# Patient Record
Sex: Male | Born: 1945 | Race: White | Hispanic: No | Marital: Married | Smoking: Former smoker
Health system: Southern US, Community
[De-identification: ages and names within clinical notes are randomized; demographics above are authoritative.]

## PROBLEM LIST (undated history)

## (undated) DIAGNOSIS — Z955 Presence of coronary angioplasty implant and graft: Secondary | ICD-10-CM

## (undated) DIAGNOSIS — R569 Unspecified convulsions: Secondary | ICD-10-CM

## (undated) HISTORY — PX: CORONARY ANGIOPLASTY WITH STENT PLACEMENT: SHX49

## (undated) HISTORY — DX: Presence of coronary angioplasty implant and graft: Z95.5

## (undated) HISTORY — DX: Unspecified convulsions: R56.9

---

## 1996-11-27 DIAGNOSIS — I219 Acute myocardial infarction, unspecified: Secondary | ICD-10-CM

## 1996-11-27 HISTORY — DX: Acute myocardial infarction, unspecified: I21.9

## 2019-05-15 DIAGNOSIS — E119 Type 2 diabetes mellitus without complications: Secondary | ICD-10-CM | POA: Diagnosis not present

## 2019-05-15 DIAGNOSIS — Z7984 Long term (current) use of oral hypoglycemic drugs: Secondary | ICD-10-CM | POA: Diagnosis not present

## 2019-05-15 DIAGNOSIS — F325 Major depressive disorder, single episode, in full remission: Secondary | ICD-10-CM | POA: Diagnosis not present

## 2019-05-15 DIAGNOSIS — Z7982 Long term (current) use of aspirin: Secondary | ICD-10-CM | POA: Diagnosis not present

## 2019-05-15 DIAGNOSIS — I10 Essential (primary) hypertension: Secondary | ICD-10-CM | POA: Diagnosis not present

## 2019-05-15 DIAGNOSIS — E785 Hyperlipidemia, unspecified: Secondary | ICD-10-CM | POA: Diagnosis not present

## 2019-05-15 DIAGNOSIS — K219 Gastro-esophageal reflux disease without esophagitis: Secondary | ICD-10-CM | POA: Diagnosis not present

## 2019-05-15 DIAGNOSIS — I252 Old myocardial infarction: Secondary | ICD-10-CM | POA: Diagnosis not present

## 2019-05-15 DIAGNOSIS — N400 Enlarged prostate without lower urinary tract symptoms: Secondary | ICD-10-CM | POA: Diagnosis not present

## 2019-07-01 DIAGNOSIS — I10 Essential (primary) hypertension: Secondary | ICD-10-CM | POA: Diagnosis not present

## 2019-07-01 DIAGNOSIS — I2510 Atherosclerotic heart disease of native coronary artery without angina pectoris: Secondary | ICD-10-CM | POA: Diagnosis not present

## 2019-07-01 DIAGNOSIS — E782 Mixed hyperlipidemia: Secondary | ICD-10-CM | POA: Diagnosis not present

## 2019-07-01 DIAGNOSIS — M79604 Pain in right leg: Secondary | ICD-10-CM | POA: Diagnosis not present

## 2019-07-16 DIAGNOSIS — E782 Mixed hyperlipidemia: Secondary | ICD-10-CM | POA: Diagnosis not present

## 2019-07-16 DIAGNOSIS — Z87891 Personal history of nicotine dependence: Secondary | ICD-10-CM | POA: Diagnosis not present

## 2019-07-16 DIAGNOSIS — F339 Major depressive disorder, recurrent, unspecified: Secondary | ICD-10-CM | POA: Diagnosis not present

## 2019-07-16 DIAGNOSIS — K219 Gastro-esophageal reflux disease without esophagitis: Secondary | ICD-10-CM | POA: Diagnosis not present

## 2019-07-16 DIAGNOSIS — I10 Essential (primary) hypertension: Secondary | ICD-10-CM | POA: Diagnosis not present

## 2019-07-16 DIAGNOSIS — R739 Hyperglycemia, unspecified: Secondary | ICD-10-CM | POA: Diagnosis not present

## 2019-07-16 DIAGNOSIS — E1159 Type 2 diabetes mellitus with other circulatory complications: Secondary | ICD-10-CM | POA: Diagnosis not present

## 2019-07-24 DIAGNOSIS — Z951 Presence of aortocoronary bypass graft: Secondary | ICD-10-CM | POA: Diagnosis not present

## 2019-07-24 DIAGNOSIS — I252 Old myocardial infarction: Secondary | ICD-10-CM | POA: Diagnosis not present

## 2019-07-24 DIAGNOSIS — Z0001 Encounter for general adult medical examination with abnormal findings: Secondary | ICD-10-CM | POA: Diagnosis not present

## 2019-07-24 DIAGNOSIS — I10 Essential (primary) hypertension: Secondary | ICD-10-CM | POA: Diagnosis not present

## 2019-07-24 DIAGNOSIS — E1159 Type 2 diabetes mellitus with other circulatory complications: Secondary | ICD-10-CM | POA: Diagnosis not present

## 2019-07-24 DIAGNOSIS — I2510 Atherosclerotic heart disease of native coronary artery without angina pectoris: Secondary | ICD-10-CM | POA: Diagnosis not present

## 2019-07-24 DIAGNOSIS — Z6825 Body mass index (BMI) 25.0-25.9, adult: Secondary | ICD-10-CM | POA: Diagnosis not present

## 2019-07-24 DIAGNOSIS — Z23 Encounter for immunization: Secondary | ICD-10-CM | POA: Diagnosis not present

## 2019-09-01 DIAGNOSIS — Z23 Encounter for immunization: Secondary | ICD-10-CM | POA: Diagnosis not present

## 2019-09-25 DIAGNOSIS — F339 Major depressive disorder, recurrent, unspecified: Secondary | ICD-10-CM | POA: Diagnosis not present

## 2019-09-25 DIAGNOSIS — R413 Other amnesia: Secondary | ICD-10-CM | POA: Diagnosis not present

## 2019-09-25 DIAGNOSIS — E1159 Type 2 diabetes mellitus with other circulatory complications: Secondary | ICD-10-CM | POA: Diagnosis not present

## 2019-09-25 DIAGNOSIS — I10 Essential (primary) hypertension: Secondary | ICD-10-CM | POA: Diagnosis not present

## 2019-09-25 DIAGNOSIS — I209 Angina pectoris, unspecified: Secondary | ICD-10-CM | POA: Diagnosis not present

## 2019-09-25 DIAGNOSIS — Z6826 Body mass index (BMI) 26.0-26.9, adult: Secondary | ICD-10-CM | POA: Diagnosis not present

## 2019-10-22 DIAGNOSIS — N401 Enlarged prostate with lower urinary tract symptoms: Secondary | ICD-10-CM | POA: Diagnosis not present

## 2019-10-22 DIAGNOSIS — F339 Major depressive disorder, recurrent, unspecified: Secondary | ICD-10-CM | POA: Diagnosis not present

## 2019-10-22 DIAGNOSIS — H9190 Unspecified hearing loss, unspecified ear: Secondary | ICD-10-CM | POA: Diagnosis not present

## 2019-10-22 DIAGNOSIS — Z6826 Body mass index (BMI) 26.0-26.9, adult: Secondary | ICD-10-CM | POA: Diagnosis not present

## 2019-10-22 DIAGNOSIS — F0390 Unspecified dementia without behavioral disturbance: Secondary | ICD-10-CM | POA: Diagnosis not present

## 2019-10-22 DIAGNOSIS — R351 Nocturia: Secondary | ICD-10-CM | POA: Diagnosis not present

## 2019-10-27 DIAGNOSIS — Z20828 Contact with and (suspected) exposure to other viral communicable diseases: Secondary | ICD-10-CM | POA: Diagnosis not present

## 2019-10-27 DIAGNOSIS — Z03818 Encounter for observation for suspected exposure to other biological agents ruled out: Secondary | ICD-10-CM | POA: Diagnosis not present

## 2019-10-27 DIAGNOSIS — R519 Headache, unspecified: Secondary | ICD-10-CM | POA: Diagnosis not present

## 2019-11-27 DIAGNOSIS — K219 Gastro-esophageal reflux disease without esophagitis: Secondary | ICD-10-CM | POA: Diagnosis not present

## 2019-11-27 DIAGNOSIS — I10 Essential (primary) hypertension: Secondary | ICD-10-CM | POA: Diagnosis not present

## 2019-12-02 DIAGNOSIS — I10 Essential (primary) hypertension: Secondary | ICD-10-CM | POA: Diagnosis not present

## 2019-12-02 DIAGNOSIS — K219 Gastro-esophageal reflux disease without esophagitis: Secondary | ICD-10-CM | POA: Diagnosis not present

## 2019-12-02 DIAGNOSIS — E1159 Type 2 diabetes mellitus with other circulatory complications: Secondary | ICD-10-CM | POA: Diagnosis not present

## 2019-12-02 DIAGNOSIS — E782 Mixed hyperlipidemia: Secondary | ICD-10-CM | POA: Diagnosis not present

## 2019-12-02 DIAGNOSIS — R739 Hyperglycemia, unspecified: Secondary | ICD-10-CM | POA: Diagnosis not present

## 2019-12-04 DIAGNOSIS — Z87442 Personal history of urinary calculi: Secondary | ICD-10-CM | POA: Diagnosis not present

## 2019-12-04 DIAGNOSIS — N401 Enlarged prostate with lower urinary tract symptoms: Secondary | ICD-10-CM | POA: Diagnosis not present

## 2019-12-04 DIAGNOSIS — R1084 Generalized abdominal pain: Secondary | ICD-10-CM | POA: Diagnosis not present

## 2019-12-05 DIAGNOSIS — Z23 Encounter for immunization: Secondary | ICD-10-CM | POA: Diagnosis not present

## 2019-12-05 DIAGNOSIS — F0390 Unspecified dementia without behavioral disturbance: Secondary | ICD-10-CM | POA: Diagnosis not present

## 2019-12-05 DIAGNOSIS — Z0001 Encounter for general adult medical examination with abnormal findings: Secondary | ICD-10-CM | POA: Diagnosis not present

## 2019-12-05 DIAGNOSIS — I10 Essential (primary) hypertension: Secondary | ICD-10-CM | POA: Diagnosis not present

## 2019-12-05 DIAGNOSIS — E1159 Type 2 diabetes mellitus with other circulatory complications: Secondary | ICD-10-CM | POA: Diagnosis not present

## 2019-12-05 DIAGNOSIS — I739 Peripheral vascular disease, unspecified: Secondary | ICD-10-CM | POA: Diagnosis not present

## 2019-12-05 DIAGNOSIS — I252 Old myocardial infarction: Secondary | ICD-10-CM | POA: Diagnosis not present

## 2019-12-05 DIAGNOSIS — K5730 Diverticulosis of large intestine without perforation or abscess without bleeding: Secondary | ICD-10-CM | POA: Diagnosis not present

## 2019-12-09 DIAGNOSIS — J069 Acute upper respiratory infection, unspecified: Secondary | ICD-10-CM | POA: Diagnosis not present

## 2019-12-09 DIAGNOSIS — Z20822 Contact with and (suspected) exposure to covid-19: Secondary | ICD-10-CM | POA: Diagnosis not present

## 2019-12-26 DIAGNOSIS — E1165 Type 2 diabetes mellitus with hyperglycemia: Secondary | ICD-10-CM | POA: Diagnosis not present

## 2019-12-26 DIAGNOSIS — I10 Essential (primary) hypertension: Secondary | ICD-10-CM | POA: Diagnosis not present

## 2019-12-26 DIAGNOSIS — E7849 Other hyperlipidemia: Secondary | ICD-10-CM | POA: Diagnosis not present

## 2020-01-23 DIAGNOSIS — E7849 Other hyperlipidemia: Secondary | ICD-10-CM | POA: Diagnosis not present

## 2020-01-23 DIAGNOSIS — I10 Essential (primary) hypertension: Secondary | ICD-10-CM | POA: Diagnosis not present

## 2020-02-15 DIAGNOSIS — R9082 White matter disease, unspecified: Secondary | ICD-10-CM | POA: Diagnosis not present

## 2020-02-15 DIAGNOSIS — R29700 NIHSS score 0: Secondary | ICD-10-CM | POA: Diagnosis not present

## 2020-02-15 DIAGNOSIS — R42 Dizziness and giddiness: Secondary | ICD-10-CM | POA: Diagnosis not present

## 2020-02-15 DIAGNOSIS — I252 Old myocardial infarction: Secondary | ICD-10-CM | POA: Diagnosis not present

## 2020-02-15 DIAGNOSIS — E785 Hyperlipidemia, unspecified: Secondary | ICD-10-CM | POA: Diagnosis not present

## 2020-02-15 DIAGNOSIS — I10 Essential (primary) hypertension: Secondary | ICD-10-CM | POA: Diagnosis not present

## 2020-02-25 DIAGNOSIS — E7849 Other hyperlipidemia: Secondary | ICD-10-CM | POA: Diagnosis not present

## 2020-02-25 DIAGNOSIS — I10 Essential (primary) hypertension: Secondary | ICD-10-CM | POA: Diagnosis not present

## 2020-03-10 DIAGNOSIS — I10 Essential (primary) hypertension: Secondary | ICD-10-CM | POA: Diagnosis not present

## 2020-03-10 DIAGNOSIS — E782 Mixed hyperlipidemia: Secondary | ICD-10-CM | POA: Diagnosis not present

## 2020-03-10 DIAGNOSIS — K219 Gastro-esophageal reflux disease without esophagitis: Secondary | ICD-10-CM | POA: Diagnosis not present

## 2020-03-10 DIAGNOSIS — E1159 Type 2 diabetes mellitus with other circulatory complications: Secondary | ICD-10-CM | POA: Diagnosis not present

## 2020-03-10 DIAGNOSIS — R739 Hyperglycemia, unspecified: Secondary | ICD-10-CM | POA: Diagnosis not present

## 2020-03-17 DIAGNOSIS — F0390 Unspecified dementia without behavioral disturbance: Secondary | ICD-10-CM | POA: Diagnosis not present

## 2020-03-17 DIAGNOSIS — I10 Essential (primary) hypertension: Secondary | ICD-10-CM | POA: Diagnosis not present

## 2020-03-17 DIAGNOSIS — Z951 Presence of aortocoronary bypass graft: Secondary | ICD-10-CM | POA: Diagnosis not present

## 2020-03-17 DIAGNOSIS — E782 Mixed hyperlipidemia: Secondary | ICD-10-CM | POA: Diagnosis not present

## 2020-03-17 DIAGNOSIS — E1159 Type 2 diabetes mellitus with other circulatory complications: Secondary | ICD-10-CM | POA: Diagnosis not present

## 2020-03-17 DIAGNOSIS — I252 Old myocardial infarction: Secondary | ICD-10-CM | POA: Diagnosis not present

## 2020-03-17 DIAGNOSIS — F339 Major depressive disorder, recurrent, unspecified: Secondary | ICD-10-CM | POA: Diagnosis not present

## 2020-03-17 DIAGNOSIS — Z6826 Body mass index (BMI) 26.0-26.9, adult: Secondary | ICD-10-CM | POA: Diagnosis not present

## 2020-03-26 DIAGNOSIS — I10 Essential (primary) hypertension: Secondary | ICD-10-CM | POA: Diagnosis not present

## 2020-03-26 DIAGNOSIS — E7849 Other hyperlipidemia: Secondary | ICD-10-CM | POA: Diagnosis not present

## 2020-04-14 DIAGNOSIS — I2510 Atherosclerotic heart disease of native coronary artery without angina pectoris: Secondary | ICD-10-CM | POA: Diagnosis not present

## 2020-04-14 DIAGNOSIS — I10 Essential (primary) hypertension: Secondary | ICD-10-CM | POA: Diagnosis not present

## 2020-04-14 DIAGNOSIS — R0789 Other chest pain: Secondary | ICD-10-CM | POA: Diagnosis not present

## 2020-04-14 DIAGNOSIS — R079 Chest pain, unspecified: Secondary | ICD-10-CM | POA: Diagnosis not present

## 2020-04-15 DIAGNOSIS — I10 Essential (primary) hypertension: Secondary | ICD-10-CM | POA: Diagnosis not present

## 2020-04-15 DIAGNOSIS — R5383 Other fatigue: Secondary | ICD-10-CM | POA: Diagnosis not present

## 2020-04-16 DIAGNOSIS — M79604 Pain in right leg: Secondary | ICD-10-CM | POA: Diagnosis not present

## 2020-04-16 DIAGNOSIS — E119 Type 2 diabetes mellitus without complications: Secondary | ICD-10-CM | POA: Diagnosis not present

## 2020-04-16 DIAGNOSIS — E782 Mixed hyperlipidemia: Secondary | ICD-10-CM | POA: Diagnosis not present

## 2020-04-16 DIAGNOSIS — I25119 Atherosclerotic heart disease of native coronary artery with unspecified angina pectoris: Secondary | ICD-10-CM | POA: Diagnosis not present

## 2020-04-16 DIAGNOSIS — R079 Chest pain, unspecified: Secondary | ICD-10-CM | POA: Diagnosis not present

## 2020-04-16 DIAGNOSIS — I209 Angina pectoris, unspecified: Secondary | ICD-10-CM | POA: Diagnosis not present

## 2020-04-16 DIAGNOSIS — R931 Abnormal findings on diagnostic imaging of heart and coronary circulation: Secondary | ICD-10-CM | POA: Diagnosis not present

## 2020-04-16 DIAGNOSIS — F329 Major depressive disorder, single episode, unspecified: Secondary | ICD-10-CM | POA: Diagnosis not present

## 2020-04-16 DIAGNOSIS — I25110 Atherosclerotic heart disease of native coronary artery with unstable angina pectoris: Secondary | ICD-10-CM | POA: Diagnosis not present

## 2020-04-16 DIAGNOSIS — I10 Essential (primary) hypertension: Secondary | ICD-10-CM | POA: Diagnosis not present

## 2020-04-16 DIAGNOSIS — M79605 Pain in left leg: Secondary | ICD-10-CM | POA: Diagnosis not present

## 2020-04-16 DIAGNOSIS — I999 Unspecified disorder of circulatory system: Secondary | ICD-10-CM | POA: Diagnosis not present

## 2020-04-16 DIAGNOSIS — Z8679 Personal history of other diseases of the circulatory system: Secondary | ICD-10-CM | POA: Diagnosis not present

## 2020-04-16 DIAGNOSIS — I2510 Atherosclerotic heart disease of native coronary artery without angina pectoris: Secondary | ICD-10-CM | POA: Diagnosis not present

## 2020-04-16 DIAGNOSIS — I2582 Chronic total occlusion of coronary artery: Secondary | ICD-10-CM | POA: Diagnosis not present

## 2020-04-16 DIAGNOSIS — K219 Gastro-esophageal reflux disease without esophagitis: Secondary | ICD-10-CM | POA: Diagnosis not present

## 2020-04-17 DIAGNOSIS — I25110 Atherosclerotic heart disease of native coronary artery with unstable angina pectoris: Secondary | ICD-10-CM | POA: Diagnosis not present

## 2020-04-17 DIAGNOSIS — Z951 Presence of aortocoronary bypass graft: Secondary | ICD-10-CM | POA: Diagnosis not present

## 2020-04-17 DIAGNOSIS — Z9889 Other specified postprocedural states: Secondary | ICD-10-CM | POA: Diagnosis not present

## 2020-04-17 DIAGNOSIS — R9439 Abnormal result of other cardiovascular function study: Secondary | ICD-10-CM | POA: Diagnosis not present

## 2020-04-17 DIAGNOSIS — E119 Type 2 diabetes mellitus without complications: Secondary | ICD-10-CM | POA: Diagnosis not present

## 2020-04-17 DIAGNOSIS — I200 Unstable angina: Secondary | ICD-10-CM | POA: Diagnosis not present

## 2020-04-17 DIAGNOSIS — Z87891 Personal history of nicotine dependence: Secondary | ICD-10-CM | POA: Diagnosis not present

## 2020-04-17 DIAGNOSIS — I10 Essential (primary) hypertension: Secondary | ICD-10-CM | POA: Diagnosis not present

## 2020-04-17 DIAGNOSIS — I110 Hypertensive heart disease with heart failure: Secondary | ICD-10-CM | POA: Diagnosis not present

## 2020-04-17 DIAGNOSIS — Z79899 Other long term (current) drug therapy: Secondary | ICD-10-CM | POA: Diagnosis not present

## 2020-04-17 DIAGNOSIS — K219 Gastro-esophageal reflux disease without esophagitis: Secondary | ICD-10-CM | POA: Diagnosis not present

## 2020-04-17 DIAGNOSIS — F329 Major depressive disorder, single episode, unspecified: Secondary | ICD-10-CM | POA: Diagnosis not present

## 2020-04-17 DIAGNOSIS — Z7982 Long term (current) use of aspirin: Secondary | ICD-10-CM | POA: Diagnosis not present

## 2020-04-17 DIAGNOSIS — I25118 Atherosclerotic heart disease of native coronary artery with other forms of angina pectoris: Secondary | ICD-10-CM | POA: Diagnosis not present

## 2020-04-21 DIAGNOSIS — I2510 Atherosclerotic heart disease of native coronary artery without angina pectoris: Secondary | ICD-10-CM | POA: Diagnosis not present

## 2020-04-21 DIAGNOSIS — M79604 Pain in right leg: Secondary | ICD-10-CM | POA: Diagnosis not present

## 2020-04-21 DIAGNOSIS — I10 Essential (primary) hypertension: Secondary | ICD-10-CM | POA: Diagnosis not present

## 2020-04-21 DIAGNOSIS — E782 Mixed hyperlipidemia: Secondary | ICD-10-CM | POA: Diagnosis not present

## 2020-04-26 DIAGNOSIS — E1159 Type 2 diabetes mellitus with other circulatory complications: Secondary | ICD-10-CM | POA: Diagnosis not present

## 2020-04-26 DIAGNOSIS — I10 Essential (primary) hypertension: Secondary | ICD-10-CM | POA: Diagnosis not present

## 2020-04-26 DIAGNOSIS — K219 Gastro-esophageal reflux disease without esophagitis: Secondary | ICD-10-CM | POA: Diagnosis not present

## 2020-04-26 DIAGNOSIS — E7849 Other hyperlipidemia: Secondary | ICD-10-CM | POA: Diagnosis not present

## 2020-06-09 DIAGNOSIS — Z87891 Personal history of nicotine dependence: Secondary | ICD-10-CM | POA: Diagnosis not present

## 2020-06-09 DIAGNOSIS — I10 Essential (primary) hypertension: Secondary | ICD-10-CM | POA: Diagnosis not present

## 2020-06-09 DIAGNOSIS — K219 Gastro-esophageal reflux disease without esophagitis: Secondary | ICD-10-CM | POA: Diagnosis not present

## 2020-06-09 DIAGNOSIS — R739 Hyperglycemia, unspecified: Secondary | ICD-10-CM | POA: Diagnosis not present

## 2020-06-09 DIAGNOSIS — E782 Mixed hyperlipidemia: Secondary | ICD-10-CM | POA: Diagnosis not present

## 2020-06-09 DIAGNOSIS — E1159 Type 2 diabetes mellitus with other circulatory complications: Secondary | ICD-10-CM | POA: Diagnosis not present

## 2020-06-14 DIAGNOSIS — Z951 Presence of aortocoronary bypass graft: Secondary | ICD-10-CM | POA: Diagnosis not present

## 2020-06-14 DIAGNOSIS — Z8042 Family history of malignant neoplasm of prostate: Secondary | ICD-10-CM | POA: Diagnosis not present

## 2020-06-14 DIAGNOSIS — I252 Old myocardial infarction: Secondary | ICD-10-CM | POA: Diagnosis not present

## 2020-06-14 DIAGNOSIS — I10 Essential (primary) hypertension: Secondary | ICD-10-CM | POA: Diagnosis not present

## 2020-06-14 DIAGNOSIS — Z955 Presence of coronary angioplasty implant and graft: Secondary | ICD-10-CM | POA: Diagnosis not present

## 2020-06-14 DIAGNOSIS — I2510 Atherosclerotic heart disease of native coronary artery without angina pectoris: Secondary | ICD-10-CM | POA: Diagnosis not present

## 2020-06-14 DIAGNOSIS — E1159 Type 2 diabetes mellitus with other circulatory complications: Secondary | ICD-10-CM | POA: Diagnosis not present

## 2020-06-14 DIAGNOSIS — Z6826 Body mass index (BMI) 26.0-26.9, adult: Secondary | ICD-10-CM | POA: Diagnosis not present

## 2020-06-15 DIAGNOSIS — H524 Presbyopia: Secondary | ICD-10-CM | POA: Diagnosis not present

## 2020-06-15 DIAGNOSIS — H25811 Combined forms of age-related cataract, right eye: Secondary | ICD-10-CM | POA: Diagnosis not present

## 2020-06-15 DIAGNOSIS — H2512 Age-related nuclear cataract, left eye: Secondary | ICD-10-CM | POA: Diagnosis not present

## 2020-06-25 DIAGNOSIS — E1159 Type 2 diabetes mellitus with other circulatory complications: Secondary | ICD-10-CM | POA: Diagnosis not present

## 2020-06-25 DIAGNOSIS — K219 Gastro-esophageal reflux disease without esophagitis: Secondary | ICD-10-CM | POA: Diagnosis not present

## 2020-06-25 DIAGNOSIS — I10 Essential (primary) hypertension: Secondary | ICD-10-CM | POA: Diagnosis not present

## 2020-06-25 DIAGNOSIS — E7849 Other hyperlipidemia: Secondary | ICD-10-CM | POA: Diagnosis not present

## 2020-07-13 DIAGNOSIS — R55 Syncope and collapse: Secondary | ICD-10-CM | POA: Diagnosis not present

## 2020-07-13 DIAGNOSIS — S0003XA Contusion of scalp, initial encounter: Secondary | ICD-10-CM | POA: Diagnosis not present

## 2020-07-13 DIAGNOSIS — E119 Type 2 diabetes mellitus without complications: Secondary | ICD-10-CM | POA: Diagnosis not present

## 2020-07-13 DIAGNOSIS — Z7982 Long term (current) use of aspirin: Secondary | ICD-10-CM | POA: Diagnosis not present

## 2020-07-13 DIAGNOSIS — R410 Disorientation, unspecified: Secondary | ICD-10-CM | POA: Diagnosis not present

## 2020-07-13 DIAGNOSIS — N200 Calculus of kidney: Secondary | ICD-10-CM | POA: Diagnosis not present

## 2020-07-13 DIAGNOSIS — Z043 Encounter for examination and observation following other accident: Secondary | ICD-10-CM | POA: Diagnosis not present

## 2020-07-13 DIAGNOSIS — S066X0A Traumatic subarachnoid hemorrhage without loss of consciousness, initial encounter: Secondary | ICD-10-CM | POA: Diagnosis not present

## 2020-07-13 DIAGNOSIS — W1830XA Fall on same level, unspecified, initial encounter: Secondary | ICD-10-CM | POA: Diagnosis not present

## 2020-07-13 DIAGNOSIS — S069X9A Unspecified intracranial injury with loss of consciousness of unspecified duration, initial encounter: Secondary | ICD-10-CM | POA: Diagnosis not present

## 2020-07-13 DIAGNOSIS — E785 Hyperlipidemia, unspecified: Secondary | ICD-10-CM | POA: Diagnosis not present

## 2020-07-13 DIAGNOSIS — Z7902 Long term (current) use of antithrombotics/antiplatelets: Secondary | ICD-10-CM | POA: Diagnosis not present

## 2020-07-13 DIAGNOSIS — Y92007 Garden or yard of unspecified non-institutional (private) residence as the place of occurrence of the external cause: Secondary | ICD-10-CM | POA: Diagnosis not present

## 2020-07-13 DIAGNOSIS — F0390 Unspecified dementia without behavioral disturbance: Secondary | ICD-10-CM | POA: Diagnosis not present

## 2020-07-13 DIAGNOSIS — I951 Orthostatic hypotension: Secondary | ICD-10-CM | POA: Diagnosis not present

## 2020-07-13 DIAGNOSIS — R079 Chest pain, unspecified: Secondary | ICD-10-CM | POA: Diagnosis not present

## 2020-07-13 DIAGNOSIS — S065X0A Traumatic subdural hemorrhage without loss of consciousness, initial encounter: Secondary | ICD-10-CM | POA: Diagnosis not present

## 2020-07-13 DIAGNOSIS — W1789XA Other fall from one level to another, initial encounter: Secondary | ICD-10-CM | POA: Diagnosis not present

## 2020-07-13 DIAGNOSIS — M5136 Other intervertebral disc degeneration, lumbar region: Secondary | ICD-10-CM | POA: Diagnosis not present

## 2020-07-13 DIAGNOSIS — R110 Nausea: Secondary | ICD-10-CM | POA: Diagnosis not present

## 2020-07-13 DIAGNOSIS — I629 Nontraumatic intracranial hemorrhage, unspecified: Secondary | ICD-10-CM | POA: Diagnosis not present

## 2020-07-13 DIAGNOSIS — I252 Old myocardial infarction: Secondary | ICD-10-CM | POA: Diagnosis not present

## 2020-07-13 DIAGNOSIS — R4182 Altered mental status, unspecified: Secondary | ICD-10-CM | POA: Diagnosis not present

## 2020-07-13 DIAGNOSIS — Z951 Presence of aortocoronary bypass graft: Secondary | ICD-10-CM | POA: Diagnosis not present

## 2020-07-13 DIAGNOSIS — Z881 Allergy status to other antibiotic agents status: Secondary | ICD-10-CM | POA: Diagnosis not present

## 2020-07-13 DIAGNOSIS — I2510 Atherosclerotic heart disease of native coronary artery without angina pectoris: Secondary | ICD-10-CM | POA: Diagnosis not present

## 2020-07-13 DIAGNOSIS — S06330A Contusion and laceration of cerebrum, unspecified, without loss of consciousness, initial encounter: Secondary | ICD-10-CM | POA: Diagnosis not present

## 2020-07-13 DIAGNOSIS — Z955 Presence of coronary angioplasty implant and graft: Secondary | ICD-10-CM | POA: Diagnosis not present

## 2020-07-13 DIAGNOSIS — W19XXXA Unspecified fall, initial encounter: Secondary | ICD-10-CM | POA: Diagnosis not present

## 2020-07-13 DIAGNOSIS — S06369A Traumatic hemorrhage of cerebrum, unspecified, with loss of consciousness of unspecified duration, initial encounter: Secondary | ICD-10-CM | POA: Diagnosis not present

## 2020-07-13 DIAGNOSIS — S066X9A Traumatic subarachnoid hemorrhage with loss of consciousness of unspecified duration, initial encounter: Secondary | ICD-10-CM | POA: Diagnosis not present

## 2020-07-13 DIAGNOSIS — S06360A Traumatic hemorrhage of cerebrum, unspecified, without loss of consciousness, initial encounter: Secondary | ICD-10-CM | POA: Diagnosis not present

## 2020-07-13 DIAGNOSIS — K219 Gastro-esophageal reflux disease without esophagitis: Secondary | ICD-10-CM | POA: Diagnosis not present

## 2020-07-13 DIAGNOSIS — S06300A Unspecified focal traumatic brain injury without loss of consciousness, initial encounter: Secondary | ICD-10-CM | POA: Diagnosis not present

## 2020-07-13 DIAGNOSIS — I10 Essential (primary) hypertension: Secondary | ICD-10-CM | POA: Diagnosis not present

## 2020-07-14 DIAGNOSIS — K219 Gastro-esophageal reflux disease without esophagitis: Secondary | ICD-10-CM | POA: Diagnosis not present

## 2020-07-14 DIAGNOSIS — I951 Orthostatic hypotension: Secondary | ICD-10-CM | POA: Diagnosis not present

## 2020-07-14 DIAGNOSIS — R55 Syncope and collapse: Secondary | ICD-10-CM | POA: Diagnosis not present

## 2020-07-14 DIAGNOSIS — R9431 Abnormal electrocardiogram [ECG] [EKG]: Secondary | ICD-10-CM | POA: Diagnosis not present

## 2020-07-14 DIAGNOSIS — I2510 Atherosclerotic heart disease of native coronary artery without angina pectoris: Secondary | ICD-10-CM | POA: Diagnosis not present

## 2020-07-14 DIAGNOSIS — S06360A Traumatic hemorrhage of cerebrum, unspecified, without loss of consciousness, initial encounter: Secondary | ICD-10-CM | POA: Diagnosis not present

## 2020-07-14 DIAGNOSIS — Z951 Presence of aortocoronary bypass graft: Secondary | ICD-10-CM | POA: Diagnosis not present

## 2020-07-14 DIAGNOSIS — E119 Type 2 diabetes mellitus without complications: Secondary | ICD-10-CM | POA: Diagnosis not present

## 2020-07-14 DIAGNOSIS — E785 Hyperlipidemia, unspecified: Secondary | ICD-10-CM | POA: Diagnosis not present

## 2020-07-14 DIAGNOSIS — I10 Essential (primary) hypertension: Secondary | ICD-10-CM | POA: Diagnosis not present

## 2020-07-14 DIAGNOSIS — S066X0A Traumatic subarachnoid hemorrhage without loss of consciousness, initial encounter: Secondary | ICD-10-CM | POA: Diagnosis not present

## 2020-07-14 DIAGNOSIS — S06330A Contusion and laceration of cerebrum, unspecified, without loss of consciousness, initial encounter: Secondary | ICD-10-CM | POA: Diagnosis not present

## 2020-07-14 DIAGNOSIS — R110 Nausea: Secondary | ICD-10-CM | POA: Diagnosis not present

## 2020-07-14 DIAGNOSIS — I609 Nontraumatic subarachnoid hemorrhage, unspecified: Secondary | ICD-10-CM | POA: Diagnosis not present

## 2020-07-15 DIAGNOSIS — R55 Syncope and collapse: Secondary | ICD-10-CM | POA: Diagnosis not present

## 2020-07-15 DIAGNOSIS — R9431 Abnormal electrocardiogram [ECG] [EKG]: Secondary | ICD-10-CM | POA: Diagnosis not present

## 2020-07-18 ENCOUNTER — Other Ambulatory Visit: Payer: Self-pay

## 2020-07-18 ENCOUNTER — Emergency Department (HOSPITAL_COMMUNITY): Payer: Medicare PPO

## 2020-07-18 ENCOUNTER — Emergency Department (HOSPITAL_COMMUNITY)
Admission: EM | Admit: 2020-07-18 | Discharge: 2020-07-18 | Disposition: A | Discharge status: Home / Self Care | Payer: Medicare PPO | Attending: Emergency Medicine | Admitting: Emergency Medicine

## 2020-07-18 DIAGNOSIS — R569 Unspecified convulsions: Secondary | ICD-10-CM | POA: Insufficient documentation

## 2020-07-18 DIAGNOSIS — S066X0A Traumatic subarachnoid hemorrhage without loss of consciousness, initial encounter: Secondary | ICD-10-CM | POA: Insufficient documentation

## 2020-07-18 DIAGNOSIS — R519 Headache, unspecified: Secondary | ICD-10-CM | POA: Diagnosis not present

## 2020-07-18 DIAGNOSIS — G40909 Epilepsy, unspecified, not intractable, without status epilepticus: Secondary | ICD-10-CM | POA: Diagnosis not present

## 2020-07-18 DIAGNOSIS — Z79899 Other long term (current) drug therapy: Secondary | ICD-10-CM | POA: Diagnosis not present

## 2020-07-18 DIAGNOSIS — R531 Weakness: Secondary | ICD-10-CM | POA: Diagnosis not present

## 2020-07-18 DIAGNOSIS — X58XXXA Exposure to other specified factors, initial encounter: Secondary | ICD-10-CM | POA: Diagnosis not present

## 2020-07-18 LAB — COMPREHENSIVE METABOLIC PANEL
ALT: 23 U/L (ref 0–44)
AST: 22 U/L (ref 15–41)
Albumin: 3.9 g/dL (ref 3.5–5.0)
Alkaline Phosphatase: 96 U/L (ref 38–126)
Anion gap: 8 (ref 5–15)
BUN: 10 mg/dL (ref 8–23)
CO2: 31 mmol/L (ref 22–32)
Calcium: 9.7 mg/dL (ref 8.9–10.3)
Chloride: 100 mmol/L (ref 98–111)
Creatinine, Ser: 0.85 mg/dL (ref 0.61–1.24)
GFR calc Af Amer: >60 mL/min (ref >60)
GFR calc non Af Amer: >60 mL/min (ref >60)
Glucose, Bld: 96 mg/dL (ref 70–99)
Potassium: 4.1 mmol/L (ref 3.5–5.1)
Sodium: 139 mmol/L (ref 135–145)
Total Bilirubin: 0.6 mg/dL (ref 0.3–1.2)
Total Protein: 6.8 g/dL (ref 6.5–8.1)

## 2020-07-18 LAB — PROTIME-INR
INR: 1 (ref 0.8–1.2)
Prothrombin Time: 12.3 seconds (ref 11.4–15.2)

## 2020-07-18 LAB — CBC
HCT: 46.4 % (ref 39.0–52.0)
Hemoglobin: 15.2 g/dL (ref 13.0–17.0)
MCH: 28.9 pg (ref 26.0–34.0)
MCHC: 32.8 g/dL (ref 30.0–36.0)
MCV: 88.2 fL (ref 80.0–100.0)
Platelets: 171 10*3/uL (ref 150–400)
RBC: 5.26 MIL/uL (ref 4.22–5.81)
RDW: 12.9 % (ref 11.5–15.5)
WBC: 9.6 10*3/uL (ref 4.0–10.5)
nRBC: 0 % (ref 0.0–0.2)

## 2020-07-18 LAB — DIFFERENTIAL
Abs Immature Granulocytes: 0.06 10*3/uL (ref 0.00–0.07)
Basophils Absolute: 0.1 10*3/uL (ref 0.0–0.1)
Basophils Relative: 1 %
Eosinophils Absolute: 0.3 10*3/uL (ref 0.0–0.5)
Eosinophils Relative: 3 %
Immature Granulocytes: 1 %
Lymphocytes Relative: 17 %
Lymphs Abs: 1.7 10*3/uL (ref 0.7–4.0)
Monocytes Absolute: 1 10*3/uL (ref 0.1–1.0)
Monocytes Relative: 10 %
Neutro Abs: 6.6 10*3/uL (ref 1.7–7.7)
Neutrophils Relative %: 68 %

## 2020-07-18 LAB — APTT: aPTT: 33 seconds (ref 24–36)

## 2020-07-18 MED ORDER — LEVETIRACETAM 500 MG PO TABS
500.0000 mg | ORAL_TABLET | Freq: Once | ORAL | Status: AC
  Administered 2020-07-18: 500 mg via ORAL
  Filled 2020-07-18: qty 1

## 2020-07-18 MED ORDER — LEVETIRACETAM 500 MG PO TABS: 500.0000 mg | ORAL_TABLET | Freq: Two times a day (BID) | ORAL | 0 refills | Status: DC

## 2020-07-18 MED ORDER — LEVETIRACETAM 500 MG PO TABS
1000.0000 mg | ORAL_TABLET | Freq: Once | ORAL | Status: AC
  Administered 2020-07-18: 1000 mg via ORAL
  Filled 2020-07-18: qty 2

## 2020-07-18 MED ORDER — SODIUM CHLORIDE 0.9% FLUSH: 3.0000 mL | Freq: Once | INTRAVENOUS | Status: DC

## 2020-07-18 NOTE — ED Provider Notes (Signed)
[facility] EMERGENCY DEPARTMENT Provider Note   CSN: [id number] Arrival date & time: 07/18/20  1417     History Chief Complaint  Patient presents with  . Weakness    George Riley is a 74 y.o. male.   Seizures Seizure activity on arrival: no   Seizure type:  Partial simple and myoclonic Preceding symptoms: headache and numbness   Initial focality:  Right-sided Episode characteristics: abnormal movements, focal shaking and partial responsiveness   Return to baseline: yes   Severity:  Moderate Timing:  Once Number of seizures this episode:  3 Progression:  Unchanged Context comment:  Recent head injury with SAH Recent head injury:  No recent head injuries PTA treatment:  None History of seizures: no        No past medical history on file.  There are no problems to display for this patient.    The histories are not reviewed yet. Please review them in the "History" navigator section and refresh this SmartLink.     No family history on file.  Social History   Tobacco Use  . Smoking status: Not on file  Substance Use Topics  . Alcohol use: Not on file  . Drug use: Not on file    Home Medications Prior to Admission medications   Medication Sig Start Date End Date Taking? Authorizing Provider  levETIRAcetam (KEPPRA) 500 MG tablet Take 1 tablet (500 mg total) by mouth 2 (two) times daily. 07/18/20 08/17/20  Sabino Donovan, MD    Allergies    Patient has no allergy information on record.  Review of Systems   Review of Systems  Constitutional: Negative for chills and fever.  HENT: Negative for congestion and rhinorrhea.   Respiratory: Negative for cough and shortness of breath.   Cardiovascular: Negative for chest pain and palpitations.  Gastrointestinal: Negative for diarrhea, nausea and vomiting.  Genitourinary: Negative for difficulty urinating and dysuria.  Musculoskeletal: Negative for arthralgias and back pain.  Skin: Negative for  color change and rash.  Neurological: Positive for seizures and headaches. Negative for light-headedness.    Physical Exam Updated Vital Signs BP 135/80 (BP Location: Right Arm)   Pulse 60   Temp 99 F (37.2 C) (Oral)   Resp 18   Ht 5\' 8"  (1.727 m)   Wt 77.1 kg   SpO2 98%   BMI 25.85 kg/m   Physical Exam Vitals and nursing note reviewed.  Constitutional:      General: He is not in acute distress.    Appearance: Normal appearance.  HENT:     Head: Normocephalic and atraumatic.     Nose: No rhinorrhea.  Eyes:     General:        Right eye: No discharge.        Left eye: No discharge.     Conjunctiva/sclera: Conjunctivae normal.     Pupils: Pupils are equal, round, and reactive to light.  Cardiovascular:     Rate and Rhythm: Normal rate and regular rhythm.  Pulmonary:     Effort: Pulmonary effort is normal.     Breath sounds: No stridor.  Abdominal:     General: Abdomen is flat. There is no distension.     Palpations: Abdomen is soft.  Musculoskeletal:        General: No deformity or signs of injury.  Skin:    General: Skin is warm and dry.  Neurological:     General: No focal deficit present.  Mental Status: He is alert. Mental status is at baseline.     Motor: No weakness.     Comments: 5 out of 5 motor strength in all extremities, sensation intact throughout, no dysmetria, no dysdiadochokinesia, no ataxia with ambulation, cranial nerves II through XII intact, alert and oriented to person place and time   Psychiatric:        Mood and Affect: Mood normal.        Behavior: Behavior normal.        Thought Content: Thought content normal.     ED Results / Procedures / Treatments   Labs (all labs ordered are listed, but only abnormal results are displayed) Labs Reviewed  PROTIME-INR  APTT  CBC  DIFFERENTIAL  COMPREHENSIVE METABOLIC PANEL    EKG EKG Interpretation  Date/Time:  Sunday July 18 2020 14:56:42 EDT Ventricular Rate:  60 PR  Interval:  146 QRS Duration: 88 QT Interval:  418 QTC Calculation: 418 R Axis:   81 Text Interpretation: Normal sinus rhythm Nonspecific T wave abnormality Abnormal ECG Confirmed by Cherlynn Perches ([id number]) on 07/18/2020 5:26:14 PM   Radiology CT HEAD WO CONTRAST  Addendum Date: 07/18/2020   ADDENDUM REPORT: 07/18/2020 22:16 ADDENDUM: A comparison study dated 07/13/2020 at 4:52 p.m. has been provided. The distribution and volume of subarachnoid hemorrhage along the left frontal convexity is not appreciably changed since the previous examination. There are no new areas of hemorrhage. The left parietal scalp hematoma seen on prior study has resolved in the interim. Electronically Signed   By: Sharlet Salina M.D.   On: 07/18/2020 22:16   Result Date: 07/18/2020 CLINICAL DATA:  Syncope yesterday, hit back of head, amnesia EXAM: CT HEAD WITHOUT CONTRAST TECHNIQUE: Contiguous axial images were obtained from the base of the skull through the vertex without intravenous contrast. COMPARISON:  None. FINDINGS: Brain: No acute infarct. Subarachnoid hemorrhage is seen within the sulci along the left frontal convexity. Lateral ventricles and midline structures are unremarkable. No mass effect. Vascular: No hyperdense vessel or unexpected calcification. Skull: Normal. Negative for fracture or focal lesion. Sinuses/Orbits: No acute finding. Other: None. IMPRESSION: 1. Acute subarachnoid hemorrhage along the sulci at the left frontal convexity. No mass effect. 2. No acute infarct. Critical Value/emergent results were called by telephone at the time of interpretation on 07/18/2020 at 4:39 pm to provider MELANIE BELFI , who verbally acknowledged these results. Electronically Signed: By: Sharlet Salina M.D. On: 07/18/2020 16:38    Procedures Procedures (including critical care time)  Medications Ordered in ED Medications  sodium chloride flush (NS) 0.9 % injection 3 mL (3 mLs Intravenous Not Given 07/18/20 2004)   levETIRAcetam (KEPPRA) tablet 1,000 mg (1,000 mg Oral Given 07/18/20 1758)  levETIRAcetam (KEPPRA) tablet 500 mg (500 mg Oral Given 07/18/20 2237)    ED Course  I have reviewed the triage vital signs and the nursing notes.  Pertinent labs & imaging results that were available during my care of the patient were reviewed by me and considered in my medical decision making (see chart for details).  Clinical Course as of Jul 18 2245  Wynelle Link Jul 18, 2020  1711 CT HEAD WO CONTRAST [EK]    Clinical Course User Index [EK] Sabino Donovan, MD   MDM Rules/Calculators/A&P                          Recent head injury seen in outside hospital known subarachnoid hemorrhage.  Right-sided focal tingling and  shaking partial responsiveness.  Likely secondary to subarachnoid given location, CT imaging today shows it, we will try to compare previous.  Neuro exam here is unremarkable.  The seizure activity is short.  Focal.  Corresponds to the injury location.  I spoke with neurology.  They agree with Keppra load and then twice daily Keppra with outpatient follow-up.  EEG would not change our plan, no further imaging is needed if the CT bleed is stable.  Lab studies were done and reviewed by myself and are unremarkable.  We were able to get the CT imaging reviewed by the radiologist compared to the old ones, there is no change.  The plan stands the patient will be discharged home outpatient Keppra strict return precautions and neurology follow-up.  They said they will likely follow-up with a neurologist closer to their home however one is offered for them.   Final Clinical Impression(s) / ED Diagnoses Final diagnoses:  Seizure [facility])    Rx / DC Orders ED Discharge Orders         Ordered    levETIRAcetam (KEPPRA) 500 MG tablet  2 times daily        07/18/20 2225           Sabino Donovan, MD 07/18/20 2246

## 2020-07-18 NOTE — ED Triage Notes (Signed)
Wife reports pt fell on 8/17 and was seen at [facility] and airlifted to [facility] in [place], [place].  Reports "brain bleed."  Discharged on Thursday afternoon.  Wife reports pt has been having "seizures" daily since fall.  Reports during "seizure" it starts as R hand weakness/numbness, R sided facial droop, and aphasia that last approx 1-2 minutes.  States pt is coherent during events and tries to speak but unable.   No arm drift.  Speech clear. Last episode while driving to hospital around 2pm.

## 2020-07-18 NOTE — ED Notes (Signed)
[facility] in [place] called to get images powershared over to [facility] system, stated  He was working on it now

## 2020-07-27 DIAGNOSIS — E7849 Other hyperlipidemia: Secondary | ICD-10-CM | POA: Diagnosis not present

## 2020-07-27 DIAGNOSIS — I10 Essential (primary) hypertension: Secondary | ICD-10-CM | POA: Diagnosis not present

## 2020-07-27 DIAGNOSIS — E1159 Type 2 diabetes mellitus with other circulatory complications: Secondary | ICD-10-CM | POA: Diagnosis not present

## 2020-07-27 DIAGNOSIS — K219 Gastro-esophageal reflux disease without esophagitis: Secondary | ICD-10-CM | POA: Diagnosis not present

## 2020-08-04 DIAGNOSIS — R569 Unspecified convulsions: Secondary | ICD-10-CM | POA: Diagnosis not present

## 2020-08-04 DIAGNOSIS — S066X0A Traumatic subarachnoid hemorrhage without loss of consciousness, initial encounter: Secondary | ICD-10-CM | POA: Diagnosis not present

## 2020-08-04 DIAGNOSIS — Z6825 Body mass index (BMI) 25.0-25.9, adult: Secondary | ICD-10-CM | POA: Diagnosis not present

## 2020-08-04 DIAGNOSIS — R030 Elevated blood-pressure reading, without diagnosis of hypertension: Secondary | ICD-10-CM | POA: Diagnosis not present

## 2020-08-13 DIAGNOSIS — S066X0A Traumatic subarachnoid hemorrhage without loss of consciousness, initial encounter: Secondary | ICD-10-CM | POA: Diagnosis not present

## 2020-08-14 DIAGNOSIS — K219 Gastro-esophageal reflux disease without esophagitis: Secondary | ICD-10-CM | POA: Diagnosis not present

## 2020-08-14 DIAGNOSIS — E782 Mixed hyperlipidemia: Secondary | ICD-10-CM | POA: Diagnosis not present

## 2020-08-14 DIAGNOSIS — Z8616 Personal history of COVID-19: Secondary | ICD-10-CM | POA: Diagnosis not present

## 2020-08-14 DIAGNOSIS — S065X9A Traumatic subdural hemorrhage with loss of consciousness of unspecified duration, initial encounter: Secondary | ICD-10-CM | POA: Diagnosis not present

## 2020-08-14 DIAGNOSIS — R569 Unspecified convulsions: Secondary | ICD-10-CM | POA: Diagnosis not present

## 2020-08-14 DIAGNOSIS — F0390 Unspecified dementia without behavioral disturbance: Secondary | ICD-10-CM | POA: Diagnosis not present

## 2020-08-14 DIAGNOSIS — E1159 Type 2 diabetes mellitus with other circulatory complications: Secondary | ICD-10-CM | POA: Diagnosis not present

## 2020-08-14 DIAGNOSIS — I609 Nontraumatic subarachnoid hemorrhage, unspecified: Secondary | ICD-10-CM | POA: Diagnosis not present

## 2020-08-25 DIAGNOSIS — R739 Hyperglycemia, unspecified: Secondary | ICD-10-CM | POA: Diagnosis not present

## 2020-08-25 DIAGNOSIS — E782 Mixed hyperlipidemia: Secondary | ICD-10-CM | POA: Diagnosis not present

## 2020-08-25 DIAGNOSIS — I10 Essential (primary) hypertension: Secondary | ICD-10-CM | POA: Diagnosis not present

## 2020-08-25 DIAGNOSIS — K219 Gastro-esophageal reflux disease without esophagitis: Secondary | ICD-10-CM | POA: Diagnosis not present

## 2020-08-25 DIAGNOSIS — E1159 Type 2 diabetes mellitus with other circulatory complications: Secondary | ICD-10-CM | POA: Diagnosis not present

## 2020-08-26 DIAGNOSIS — I10 Essential (primary) hypertension: Secondary | ICD-10-CM | POA: Diagnosis not present

## 2020-08-26 DIAGNOSIS — E1159 Type 2 diabetes mellitus with other circulatory complications: Secondary | ICD-10-CM | POA: Diagnosis not present

## 2020-08-26 DIAGNOSIS — K219 Gastro-esophageal reflux disease without esophagitis: Secondary | ICD-10-CM | POA: Diagnosis not present

## 2020-08-26 DIAGNOSIS — E7849 Other hyperlipidemia: Secondary | ICD-10-CM | POA: Diagnosis not present

## 2020-08-31 DIAGNOSIS — E1159 Type 2 diabetes mellitus with other circulatory complications: Secondary | ICD-10-CM | POA: Diagnosis not present

## 2020-08-31 DIAGNOSIS — I10 Essential (primary) hypertension: Secondary | ICD-10-CM | POA: Diagnosis not present

## 2020-08-31 DIAGNOSIS — Z23 Encounter for immunization: Secondary | ICD-10-CM | POA: Diagnosis not present

## 2020-08-31 DIAGNOSIS — I609 Nontraumatic subarachnoid hemorrhage, unspecified: Secondary | ICD-10-CM | POA: Diagnosis not present

## 2020-08-31 DIAGNOSIS — R569 Unspecified convulsions: Secondary | ICD-10-CM | POA: Diagnosis not present

## 2020-08-31 DIAGNOSIS — F0390 Unspecified dementia without behavioral disturbance: Secondary | ICD-10-CM | POA: Diagnosis not present

## 2020-08-31 DIAGNOSIS — S065X9A Traumatic subdural hemorrhage with loss of consciousness of unspecified duration, initial encounter: Secondary | ICD-10-CM | POA: Diagnosis not present

## 2020-08-31 DIAGNOSIS — E782 Mixed hyperlipidemia: Secondary | ICD-10-CM | POA: Diagnosis not present

## 2020-09-02 ENCOUNTER — Encounter: Payer: Self-pay | Admitting: Neurology

## 2020-09-02 ENCOUNTER — Ambulatory Visit: Payer: Medicare PPO | Admitting: Neurology

## 2020-09-02 VITALS — BP 144/71 | HR 59 | Ht 68.0 in | Wt 172.2 lb

## 2020-09-02 DIAGNOSIS — G441 Vascular headache, not elsewhere classified: Secondary | ICD-10-CM

## 2020-09-02 DIAGNOSIS — R569 Unspecified convulsions: Secondary | ICD-10-CM

## 2020-09-02 DIAGNOSIS — I609 Nontraumatic subarachnoid hemorrhage, unspecified: Secondary | ICD-10-CM

## 2020-09-02 DIAGNOSIS — I219 Acute myocardial infarction, unspecified: Secondary | ICD-10-CM | POA: Diagnosis not present

## 2020-09-02 DIAGNOSIS — G3184 Mild cognitive impairment, so stated: Secondary | ICD-10-CM

## 2020-09-02 DIAGNOSIS — R799 Abnormal finding of blood chemistry, unspecified: Secondary | ICD-10-CM | POA: Diagnosis not present

## 2020-09-02 DIAGNOSIS — E538 Deficiency of other specified B group vitamins: Secondary | ICD-10-CM | POA: Diagnosis not present

## 2020-09-02 MED ORDER — LEVETIRACETAM 750 MG PO TABS: 750.0000 mg | ORAL_TABLET | Freq: Two times a day (BID) | ORAL | 3 refills | Status: DC

## 2020-09-02 NOTE — Patient Instructions (Addendum)
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  I recommend we increase the dose of Keppra to 750 mg twice daily as he has had a few intermittent episodes of right upper extremity paresthesias which may represent breakthrough partial seizures.  I advised the patient not to drive for 6 months as per [facility] and to avoid seizure triggering factors like medication noncompliance, sleep deprivation, irregular eating and sleeping habits and extremes of exertion..  Check EEG for silent seizures as well as CT angiogram for aneurysm and CT venogram for venous sinus thrombosis.  I encouraged the patient to increase participation in cognitively challenging activities like solving crossword puzzles, playing bridge and sodoku.  We also discussed memory compensation strategies.  Check lab work for reversible causes of memory loss.  He will return for follow-up in the future in 2 months or call earlier if necessary. Memory Compensation Strategies  1. Use "WARM" strategy.  W= write it down  A= associate it  R= repeat it  M= make a mental note  2.   You can keep a Glass blower/designer.  Use a 3-ring notebook with sections for the following: calendar, important names and phone numbers,  medications, doctors' names/phone numbers, lists/reminders, and a section to journal what you did  each day.   3.    Use a calendar to write appointments down.  4.    Write yourself a schedule for the day.  This can be placed on the calendar or in a separate section of the Memory Notebook.  Keeping a  regular schedule can help memory.  5.    Use medication organizer with sections for each day or morning/evening pills.  You may need help loading it  6.    Keep a basket, or pegboard by the door.  Place items that you need to take out with you in the basket or on the pegboard.  You may also  want to  include a message board for reminders.  7.    Use sticky notes.  Place sticky notes with reminders in a place where the task is performed.  For example: " turn off the  stove" placed by the stove, "lock the door" placed on the door at eye level, " take your medications" on  the bathroom mirror or by the place where you normally take your medications.  8.    Use alarms/timers.  Use while cooking to remind yourself to check on food or as a reminder to take your medicine, or as a  reminder to make a call, or as a reminder to perform another task, etc.

## 2020-09-02 NOTE — Progress Notes (Signed)
[facility] [address] [place]. [place] [address] [phone]       OFFICE CONSULT NOTE  [name] Date of Birth:  11/09/1946 Medical Record Number:  [id number]   Referring MD:  Kendell Bane Dawley  Dose  Reason for Referral: Seizures and subarachnoid hemorrhage  HPI: George Riley is a pleasant 74 year old Caucasian male seen today for initial office consultation visit.  Is accompanied by his wife.  History is obtained from them and review of referral notes.  I have reviewed available pertinent imaging films in PACS.  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  He had an episode in August 2021 when he fell down at home in the driveway on the concrete.  It was an unwitnessed fall the patient's wife noticed that he was trying to get up and he had a right parietal abrasion scalp hematoma with bleeding on the ground.  He did not lose consciousness.  He was taken to [facility] from where he was transferred to [facility] where he was admitted for 2 3 days.  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  He did not require any surgical intervention.  Patient was discharged home and on the way was noted by wife to have seizures.  He had been started on Keppra during the hospitalization but did not have a prescription for it after discharge.  He was subsequently seen at [facility] and CT scan of the head on 07/18/2020 which I have reviewed shows left convexity subarachnoid hemorrhage is reported suppose to show no significant change compared with the one done in [place] 5 days ago.  I do not have access to the records at [facility] and do not know if a CT angiogram and CT venogram were performed.  Patient for started on Keppra 500 twice daily at [facility] and has not had any subsequent seizures.  He was seen by neurosurgeon Dr.  Jake Samples who referred the patient to [place] for seizure management.  Patient states he has had no further weakness generalized tonic-clonic seizures but did have a few episodes of intermittent right upper extremity paresthesias.  He is feels he is tolerating Keppra and the current dose well though he feels a little sleepy.  He denies any dizziness or changes in his personality.  He does have however short-term memory difficulties ever since his brain hemorrhage which have not improved.  He has trouble remembering recent information.  He can remember things in the past quite well.  He denies any significant headaches gait or balance problems.  He has no family history of brain aneurysms.  ROS:   14 system review of systems is positive for memory loss, difficulty with concentration, headache, seizures, loss of consciousness, paresthesias, tingling numbness PMH:  Past Medical History:  Diagnosis Date  . History of heart artery stent 2000, 2021  . MI (myocardial infarction) (HCC) 1998   Triple Bypass  . Seizures (HCC)    started with stroke    Social History:  Social History   Socioeconomic History  . Marital status: Married    Spouse name: Latanya Presser  . Number of children: 2  . Years of education: Not on file  . Highest education level: Not on file  Occupational History  . Occupation: retired  Tobacco Use  . Smoking status: Former Smoker    Quit date: 2001  Years since quitting: 20.7  . Smokeless tobacco: Never Used  Substance and Sexual Activity  . Alcohol use: Never  . Drug use: Never  . Sexual activity: Not on file  Other Topics Concern  . Not on file  Social History Narrative   Lives with spouse   Right handed   Drinks 5+ cups of caffeine daily   Social Determinants of Health   Financial Resource Strain:   . Difficulty of Paying Living Expenses: Not on file  Food Insecurity:   . Worried About Programme researcher, broadcasting/film/video in the Last Year: Not on file  . Ran Out of Food in the Last  Year: Not on file  Transportation Needs:   . Lack of Transportation (Medical): Not on file  . Lack of Transportation (Non-Medical): Not on file  Physical Activity:   . Days of Exercise per Week: Not on file  . Minutes of Exercise per Session: Not on file  Stress:   . Feeling of Stress : Not on file  Social Connections:   . Frequency of Communication with Friends and Family: Not on file  . Frequency of Social Gatherings with Friends and Family: Not on file  . Attends Religious Services: Not on file  . Active Member of Clubs or Organizations: Not on file  . Attends Banker Meetings: Not on file  . Marital Status: Not on file  Intimate Partner Violence:   . Fear of Current or Ex-Partner: Not on file  . Emotionally Abused: Not on file  . Physically Abused: Not on file  . Sexually Abused: Not on file    Medications:   Current Outpatient Medications on File Prior to Visit  Medication Sig Dispense Refill  . aspirin EC 81 MG tablet Take 81 mg by mouth daily. Swallow whole.    . clopidogrel (PLAVIX) 75 MG tablet Take 75 mg by mouth daily.    Marland Kitchen escitalopram (LEXAPRO) 20 MG tablet Take 20 mg by mouth daily.    . nitroGLYCERIN (NITROSTAT) 0.4 MG SL tablet Place 0.4 mg under the tongue every 5 (five) minutes as needed for chest pain.    Marland Kitchen omeprazole (PRILOSEC) 20 MG capsule Take 20 mg by mouth daily.    . rosuvastatin (CRESTOR) 20 MG tablet Take 20 mg by mouth at bedtime.    . tamsulosin (FLOMAX) 0.4 MG CAPS capsule Take 0.4 mg by mouth.     No current facility-administered medications on file prior to visit.    Allergies:  No Known Allergies  Physical Exam General: well developed, well nourished elderly Caucasian male, seated, in no evident distress Head: head normocephalic and atraumatic.   Neck: supple with no carotid or supraclavicular bruits Cardiovascular: regular rate and rhythm, no murmurs Musculoskeletal: no deformity Skin:  no rash/petichiae Vascular:  Normal  pulses all extremities  Neurologic Exam Mental Status: Awake and fully alert. Oriented to place and time. Recent and remote memory intact. Attention span, concentration and fund of knowledge appropriate. Mood and affect appropriate.  Diminished recall 2/3.  Mini-Mental status exam he scored 25/30 with deficits in orientation and attention calculation.  Clock drawing 4/4.  Able to name 10 animals which can walk on 4 legs. Cranial Nerves: Fundoscopic exam reveals sharp disc margins. Pupils equal, briskly reactive to light. Extraocular movements full without nystagmus. Visual fields full to confrontation. Hearing intact. Facial sensation intact. Face, tongue, palate moves normally and symmetrically.  Motor: Normal bulk and tone. Normal strength in all tested extremity muscles. Sensory.: intact  to touch , pinprick , position and vibratory sensation.  Coordination: Rapid alternating movements normal in all extremities. Finger-to-nose and heel-to-shin performed accurately bilaterally. Gait and Station: Arises from chair without difficulty. Stance is normal. Gait demonstrates normal stride length and balance . Able to heel, toe and tandem walk with slight difficulty.  Reflexes: 1+ and symmetric. Toes downgoing.   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  I recommend we increase the dose of Keppra to 750 mg twice daily as he has had a few intermittent episodes of right upper extremity paresthesias which may  represent breakthrough partial seizures.  I advised the patient not to drive for 6 months as per [facility] and to avoid seizure triggering factors like medication noncompliance, sleep deprivation, irregular eating and sleeping habits and extremes of exertion..  Check EEG for silent seizures as well as CT angiogram for aneurysm and CT venogram for venous sinus thrombosis.  I encouraged the patient to increase participation in cognitively challenging activities like solving crossword puzzles, playing bridge and sodoku.  We also discussed memory compensation strategies.  Check lab work for reversible causes of memory loss.  Greater than 50% time during this 45-minute consultation visit was spent in counseling and coordination of care about his traumatic subarachnoid hemorrhage and symptomatic seizures and mild cognitive impairment and answering questions.  He will return for follow-up in the future in 2 months or call earlier if necessary. Delia Heady, MD Note: This document was prepared with digital dictation and possible smart phrase technology. Any transcriptional errors that result from this process are unintentional.

## 2020-09-03 LAB — DEMENTIA PANEL
Homocysteine: 8.7 umol/L (ref 0.0–19.2)
RPR Ser Ql: NONREACTIVE
TSH: 1.16 u[IU]/mL (ref 0.450–4.500)
Vitamin B-12: 434 pg/mL (ref 232–1245)

## 2020-09-05 NOTE — Progress Notes (Signed)
Kindly inform the patient that lab work for reversible causes of memory loss was all normal

## 2020-09-06 ENCOUNTER — Telehealth: Payer: Self-pay | Admitting: Neurology

## 2020-09-06 NOTE — Telephone Encounter (Signed)
CT venogram

## 2020-09-06 NOTE — Telephone Encounter (Signed)
Noted, order sent to GI. They will reach out to the patient to schedule.  

## 2020-09-06 NOTE — Telephone Encounter (Signed)
George Riley: [id number] (exp. 09/06/20 to 111/10/21) order sent to GI.  I am unable to get both the CT's approved because to the insurance it is the same CPT code. Which one do you prefer the CT Angio Head or the CT Venogram Head??

## 2020-09-10 DIAGNOSIS — S066X0A Traumatic subarachnoid hemorrhage without loss of consciousness, initial encounter: Secondary | ICD-10-CM | POA: Diagnosis not present

## 2020-09-14 ENCOUNTER — Telehealth: Payer: Self-pay | Admitting: Neurology

## 2020-09-14 NOTE — Telephone Encounter (Signed)
Error

## 2020-09-15 ENCOUNTER — Other Ambulatory Visit: Payer: Medicare PPO

## 2020-09-16 ENCOUNTER — Ambulatory Visit
Admission: RE | Admit: 2020-09-16 | Discharge: 2020-09-16 | Disposition: A | Discharge status: Home / Self Care | Payer: Medicare PPO | Source: Ambulatory Visit | Attending: Neurology | Admitting: Neurology

## 2020-09-16 ENCOUNTER — Inpatient Hospital Stay: Admission: RE | Admit: 2020-09-16 | Payer: Medicare PPO | Source: Ambulatory Visit

## 2020-09-16 DIAGNOSIS — R519 Headache, unspecified: Secondary | ICD-10-CM | POA: Diagnosis not present

## 2020-09-16 MED ORDER — IOPAMIDOL (ISOVUE-370) INJECTION 76%
75.0000 mL | Freq: Once | INTRAVENOUS | Status: AC | PRN
  Administered 2020-09-16: 75 mL via INTRAVENOUS

## 2020-09-20 ENCOUNTER — Other Ambulatory Visit: Payer: Medicare PPO

## 2020-09-22 ENCOUNTER — Other Ambulatory Visit: Payer: Medicare PPO

## 2020-09-22 ENCOUNTER — Other Ambulatory Visit: Payer: Self-pay

## 2020-09-22 DIAGNOSIS — R569 Unspecified convulsions: Secondary | ICD-10-CM

## 2020-09-23 ENCOUNTER — Telehealth: Payer: Self-pay | Admitting: Neurology

## 2020-09-23 NOTE — Telephone Encounter (Signed)
Pt's wife([place],SUE on DPR) wants to know if the flashing lights from EEG could cause pt to be groggy today

## 2020-09-23 NOTE — Telephone Encounter (Signed)
Asked Dr. Lucia Gaskins [facility]) to review this call and she stated that no, the flashing lights would not make him tired/groggy.  Called and discussed with patient's wife and she stated that the EEG was yesterday and patient was more tired than usual today.  She says he gets like this from time to time.  She had no more questions and expressed appreciation.

## 2020-10-04 ENCOUNTER — Telehealth: Payer: Self-pay | Admitting: Neurology

## 2020-10-04 NOTE — Telephone Encounter (Signed)
Called and spoke to patient and wife on speaker phone.  They are asking what results are of the CT and EEG are.  Wanting to discontinue Keppra, he doesn't like how it makes him feel.

## 2020-10-04 NOTE — Telephone Encounter (Signed)
Pt's wife checking on status of results. Would like a call from the nurse.

## 2020-10-05 ENCOUNTER — Other Ambulatory Visit: Payer: Self-pay | Admitting: Neurology

## 2020-10-05 MED ORDER — LEVETIRACETAM ER 1000 MG PO TB24: 1000.0000 mg | ORAL_TABLET | Freq: Every day | ORAL | 3 refills | Status: DC

## 2020-10-05 NOTE — Telephone Encounter (Signed)
I called the patient and spoke to him and his wife and gave results of EEG which was normal and CT venogram which showed no evidence of cerebral venous sinus thrombosis.  Is felt that the Keppra was making him dizzy and wondered if the dose could be reduced.  I recommend he switch to Keppra XR 1000 mg daily which would be better tolerated.  I will send the prescription to his stated pharmacy in [place]

## 2020-10-07 ENCOUNTER — Telehealth: Payer: Self-pay | Admitting: Neurology

## 2020-10-07 ENCOUNTER — Other Ambulatory Visit: Payer: Self-pay | Admitting: Emergency Medicine

## 2020-10-07 DIAGNOSIS — M79605 Pain in left leg: Secondary | ICD-10-CM | POA: Diagnosis not present

## 2020-10-07 DIAGNOSIS — M79604 Pain in right leg: Secondary | ICD-10-CM | POA: Diagnosis not present

## 2020-10-07 DIAGNOSIS — E782 Mixed hyperlipidemia: Secondary | ICD-10-CM | POA: Diagnosis not present

## 2020-10-07 DIAGNOSIS — I2510 Atherosclerotic heart disease of native coronary artery without angina pectoris: Secondary | ICD-10-CM | POA: Diagnosis not present

## 2020-10-07 DIAGNOSIS — I10 Essential (primary) hypertension: Secondary | ICD-10-CM | POA: Diagnosis not present

## 2020-10-07 MED ORDER — LEVETIRACETAM ER 1000 MG PO TB24: 1000.0000 mg | ORAL_TABLET | Freq: Every day | ORAL | 3 refills | Status: DC

## 2020-10-07 NOTE — Telephone Encounter (Signed)
Called patient's wife and informed her I resent prescription and to let let me know if the pharmacy hasn't received it, we are here til 5 today. Patient expressed appreciation.

## 2020-10-07 NOTE — Telephone Encounter (Signed)
Pt's wife called stating that the pharmacy informed them that they have not received the pt's escript for his levETIRAcetam 1000 MG TB24 They would like to know if this can be resent to the pharmacy.

## 2020-10-08 NOTE — Progress Notes (Signed)
Kindly inform the patient that EEG study was normal

## 2020-10-08 NOTE — Progress Notes (Signed)
Kindly inform the patient that CT venogram of the brain shows no evidence of blood clots in the large brain venous sinuses.  CT scan shows old small stroke in the deep portion on the left side

## 2020-10-12 ENCOUNTER — Telehealth: Payer: Self-pay | Admitting: Emergency Medicine

## 2020-10-12 NOTE — Telephone Encounter (Signed)
Called patient and spoke to wife, patient was sleeping, went over Dr. Marlis Edelson findings of EEG and CT Venogram..  Wife stated she is writing down any questions in notebook she will bring with her to discuss with Dr. Pearlean Brownie.  Wife expressed appreciation.

## 2020-10-12 NOTE — Telephone Encounter (Signed)
-----   Message from Micki Riley, MD sent at 10/08/2020  9:00 AM EST ----- Kindly inform the patient that CT venogram of the brain shows no evidence of blood clots in the large brain venous sinuses.  CT scan shows old small stroke in the deep portion on the left side

## 2020-10-14 ENCOUNTER — Telehealth: Payer: Self-pay | Admitting: Neurology

## 2020-10-14 NOTE — Telephone Encounter (Signed)
I got this message after 4.30 PM?   Yes, going to bid is possible. CD

## 2020-10-14 NOTE — Telephone Encounter (Signed)
Pt['s wife Jhovany Weidinger (on [place]) called, would like to discuss changing levETIRAcetam ER 1000 MG TB24 to take 1 tablet in morning and 1 tablet at night. Also is it safe for hime to take his Meclizine for inner ear. Would like a call from the nurse.

## 2020-10-14 NOTE — Telephone Encounter (Signed)
Hello Dr. Vickey Huger,  You are the [facility] ([facility] this afternoon, this is a patient of Dr. Marlis Edelson.  This patient called and stated that he has been more dizzy than usual and wants to resume taking the meclizine but were afraid to because the pharmacist told them that it could stop his breathing, the two together cause respiratory depression.    Also, patient is taking 2 500 mg of kepra daily and would like to split it up in 500/500 and thought that might help his dizziness?    Please advise.  Thank you Cicero Duck, RN

## 2020-10-19 ENCOUNTER — Ambulatory Visit: Payer: Medicare PPO | Admitting: Diagnostic Neuroimaging

## 2020-10-19 NOTE — Telephone Encounter (Signed)
I agree with plan as suggested by Dr Marylou Flesher

## 2020-10-19 NOTE — Telephone Encounter (Signed)
Attempted to call and no answer on 10/18/20  Reached patient by phone, spoke to wife.  I went over Dr. Oliva Bustard answer and told her Juan could take the Keppra in two 500 mg dosages, am/pm.  She said she would try that and hopefully his dizziness would go away.  Told her dr. Pearlean Brownie was out of the office til Monday but I would forward this message to him so he could review it.  She is to call if there are any changes or concerns.

## 2020-10-26 ENCOUNTER — Other Ambulatory Visit: Payer: Self-pay | Admitting: Neurology

## 2020-10-26 DIAGNOSIS — E7849 Other hyperlipidemia: Secondary | ICD-10-CM | POA: Diagnosis not present

## 2020-10-26 DIAGNOSIS — E1159 Type 2 diabetes mellitus with other circulatory complications: Secondary | ICD-10-CM | POA: Diagnosis not present

## 2020-10-26 DIAGNOSIS — I10 Essential (primary) hypertension: Secondary | ICD-10-CM | POA: Diagnosis not present

## 2020-10-26 MED ORDER — DIVALPROEX SODIUM ER 500 MG PO TB24: 500.0000 mg | ORAL_TABLET | Freq: Every day | ORAL | 2 refills | Status: DC

## 2020-10-26 NOTE — Telephone Encounter (Signed)
I called and left a message on the patient's significant other's answering machine stating that Keppra can be tapered and discontinued as follows 1 tablet daily into 1 week and then stop.  However the patient needs to be on alternative seizure medication as he had several seizures and I recommend he start Depakote ER 500 mg daily and overlap it with Keppra for at least 1 week before stopping Keppra.

## 2020-10-26 NOTE — Telephone Encounter (Signed)
Pt's wife has called asking about pt being weaned off of Keppra, please call wife to discuss.

## 2020-10-26 NOTE — Telephone Encounter (Signed)
Called wife and stated Jeryn continues to stay nauseated all the time as well as sleeping all the time.  Wanting to have patient weaned off if he doesn't need it.  He had a MRI done by Dr. Jake Samples and she said they were told his bleed was completely healed.   He has been taking it twice a day instead of once daily and it hasn't gotten any better, he just feels like he has 'pressure' in his head all the time and they think it is from the medicine.

## 2020-10-27 ENCOUNTER — Other Ambulatory Visit: Payer: Self-pay | Admitting: Emergency Medicine

## 2020-10-27 MED ORDER — DIVALPROEX SODIUM ER 500 MG PO TB24: 500.0000 mg | ORAL_TABLET | Freq: Every day | ORAL | 2 refills | Status: DC

## 2020-10-27 NOTE — Telephone Encounter (Signed)
Called patient and spoke to wife.  Prescription was resent to pharmacy.

## 2020-10-27 NOTE — Telephone Encounter (Signed)
Pt's wife called stating that the medication has not been called in to the pharmacy so they were not able to start the medication as requested. Please advise.

## 2020-10-28 NOTE — Telephone Encounter (Signed)
Thanks

## 2020-11-05 ENCOUNTER — Telehealth: Payer: Self-pay | Admitting: Neurology

## 2020-11-05 NOTE — Telephone Encounter (Signed)
Wife([place],SUE on DPR) states even on divalproex (DEPAKOTE ER) 500 MG 24 hr tablet pt is still staggering,she wants to discuss pt going on another medication

## 2020-11-08 NOTE — Telephone Encounter (Signed)
Called wife Olegario Messier back and she stated that he is shuffling his feet and he is complaining about a pressure over his forehead.  They believe it is from the medication and wants to know if he has to continue to take it.  Patient wants to sit around with his eyes closed all day.

## 2020-11-08 NOTE — Telephone Encounter (Signed)
Pt's wife called again wanting to know when she will be able to speak to the provider or RN. Please advise.

## 2020-11-08 NOTE — Telephone Encounter (Signed)
I spoke to the patient's wife who informed me that patient was still complaining of dizziness and headache even after stopping the Keppra and taking Depakote ER 500 mg daily only.  She seemed convinced it was the medications which was doing this.  I recommend he taper the Depakote ER 500 mg to every other day for a week and stop.  This does carry the risk of breakthrough seizures and if this were to happen we may have to start her him on alternative medication like Dilantin.  She voiced understanding.

## 2020-11-24 ENCOUNTER — Ambulatory Visit: Payer: Medicare PPO | Admitting: Neurology

## 2020-11-24 ENCOUNTER — Encounter: Payer: Self-pay | Admitting: Neurology

## 2020-11-24 VITALS — BP 123/66 | HR 62 | Ht 68.0 in | Wt 174.0 lb

## 2020-11-24 DIAGNOSIS — R42 Dizziness and giddiness: Secondary | ICD-10-CM

## 2020-11-24 MED ORDER — DIVALPROEX SODIUM ER 500 MG PO TB24: 500.0000 mg | ORAL_TABLET | Freq: Every day | ORAL | 2 refills | Status: DC

## 2020-11-24 NOTE — Progress Notes (Signed)
[facility] [address] [place]. [place] [address] [phone]       OFFICE FOLLOW UP VISIT NOTE  [name] Date of Birth:  Dec 07, 1945 Medical Record Number:  [id number]   Referring MD:  Kendell Bane Dawley  Dose  Reason for Referral: Seizures and subarachnoid hemorrhage  IHW:TUUEKCM visit 09/02/2020 : George Riley is a pleasant 74 year old Caucasian male seen today for initial office consultation visit.  Is accompanied by his wife.  History is obtained from them and review of referral notes.  I have reviewed available pertinent imaging films in PACS.  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  He had an episode in August 2021 when he fell down at home in the driveway on the concrete.  It was an unwitnessed fall the patient's wife noticed that he was trying to get up and he had a right parietal abrasion scalp hematoma with bleeding on the ground.  He did not lose consciousness.  He was taken to [facility] from where he was transferred to [facility] where he was admitted for 2 3 days.  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  He did not require any surgical intervention.  Patient was discharged home and on the way was noted by wife to have seizures.  He had been started on Keppra during the hospitalization but did not have a prescription for it after discharge.  He was subsequently seen at [facility] and CT scan of the head on 07/18/2020 which I have reviewed shows left convexity subarachnoid hemorrhage is reported suppose to show no significant change compared with the one done in [place] 5 days ago.  I do not have access to the records at [facility] and do not know if a CT angiogram and CT venogram were performed.  Patient for started on Keppra 500 twice daily at [facility] and has not had any subsequent seizures.   He was seen by neurosurgeon Dr. Jake Samples who referred the patient to [place] for seizure management.  Patient states he has had no further weakness generalized tonic-clonic seizures but did have a few episodes of intermittent right upper extremity paresthesias.  He is feels he is tolerating Keppra and the current dose well though he feels a little sleepy.  He denies any dizziness or changes in his personality.  He does have however short-term memory difficulties ever since his brain hemorrhage which have not improved.  He has trouble remembering recent information.  He can remember things in the past quite well.  He denies any significant headaches gait or balance problems.  He has no family history of brain aneurysms. Update 09/02/2020 : He returns for follow-up after last visit to end of months ago.  Is accompanied by his wife.  Patient states he had trouble tolerating Keppra and complained of dizziness and head pressure and hence was switched to Depakote ER but he had trouble with that as well and stopped that too.  However he states the pressure in his head is still there but the dizziness is improved.  He said no further episodes of paresthesias or seizure-like episodes.  He denies to headaches but states that his head feels heavy and this feeling can come and go without warning.  There are no specific triggers or relieving factors.  This is not annoying but does bother him slightly.  He did undergo lab work  at last visit and vitamin B12, TSH, RPR and homocystine were all normal.  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  CT venogram was normal.  EEG done on 09/28/2020 was normal. ROS:   14 system review of systems is positive for memory loss, difficulty with concentration, headache, seizures, loss of consciousness, paresthesias, tingling numbness PMH:  Past Medical History:  Diagnosis Date  . History of heart artery stent 2000, 2021  . MI  (myocardial infarction) (HCC) 1998   Triple Bypass  . Seizures (HCC)    started with stroke    Social History:  Social History   Socioeconomic History  . Marital status: Married    Spouse name: Latanya Presser  . Number of children: 2  . Years of education: Not on file  . Highest education level: Not on file  Occupational History  . Occupation: retired  Tobacco Use  . Smoking status: Former Smoker    Quit date: 2001    Years since quitting: 21.0  . Smokeless tobacco: Never Used  Substance and Sexual Activity  . Alcohol use: Never  . Drug use: Never  . Sexual activity: Not on file  Other Topics Concern  . Not on file  Social History Narrative   Lives with spouse   Right handed   Drinks 5+ cups of caffeine daily   Social Determinants of Health   Financial Resource Strain: Not on file  Food Insecurity: Not on file  Transportation Needs: Not on file  Physical Activity: Not on file  Stress: Not on file  Social Connections: Not on file  Intimate Partner Violence: Not on file    Medications:   Current Outpatient Medications on File Prior to Visit  Medication Sig Dispense Refill  . aspirin EC 81 MG tablet Take 81 mg by mouth daily. Swallow whole.    . escitalopram (LEXAPRO) 20 MG tablet Take 20 mg by mouth daily.    . nitroGLYCERIN (NITROSTAT) 0.4 MG SL tablet Place 0.4 mg under the tongue every 5 (five) minutes as needed for chest pain.    Marland Kitchen omeprazole (PRILOSEC) 20 MG capsule Take 20 mg by mouth daily.    . rosuvastatin (CRESTOR) 20 MG tablet Take 20 mg by mouth at bedtime.    . tamsulosin (FLOMAX) 0.4 MG CAPS capsule Take 0.4 mg by mouth.     No current facility-administered medications on file prior to visit.    Allergies:   Allergies  Allergen Reactions  . Tetracycline     Physical Exam General: well developed, well nourished elderly Caucasian male, seated, in no evident distress Head: head normocephalic and atraumatic.   Neck: supple with no carotid or  supraclavicular bruits Cardiovascular: regular rate and rhythm, no murmurs Musculoskeletal: no deformity Skin:  no rash/petichiae Vascular:  Normal pulses all extremities  Neurologic Exam Mental Status: Awake and fully alert. Oriented to place and time. Recent and remote memory intact. Attention span, concentration and fund of knowledge appropriate. Mood and affect appropriate.  Diminished recall 2/3.  Mini-Mental status exam not done today ( last visit 09/02/20 he scored 25/30 with deficits in orientation and attention calculation.)  Clock drawing 4/4.  Able to name 10 animals which can walk on 4 legs. Cranial Nerves: Fundoscopic exam not done Pupils equal, briskly reactive to light. Extraocular movements full without nystagmus. Visual fields full to confrontation. Hearing intact. Facial sensation intact. Face, tongue, palate moves normally and symmetrically.  Motor: Normal bulk and  tone. Normal strength in all tested extremity muscles. Sensory.: intact to touch , pinprick , position and vibratory sensation.  Coordination: Rapid alternating movements normal in all extremities. Finger-to-nose and heel-to-shin performed accurately bilaterally. Gait and Station: Arises from chair without difficulty. Stance is normal. Gait demonstrates normal stride length and balance . Able to heel, toe and tandem walk with slight difficulty.  Reflexes: 1+ and symmetric. Toes downgoing.      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  He had some  trouble tolerating Keppra and hence I switched him to Depakote which he also stopped but he is now willing to go back on it.  I recommend only a small dose of Depakote ER mg  500 mg once daily.  He was advised not to drive for 6 months since his episode of loss of consciousness in August this year.  He will return for follow-up in the future in 3 months or call earlier if necessary.  Greater than 50% time during this 25-minute   visit was spent in counseling and coordination of care about his traumatic subarachnoid hemorrhage and symptomatic seizures and mild cognitive impairment and answering questions.   Delia Heady, MD Note: This document was prepared with digital dictation and possible smart phrase technology. Any transcriptional errors that result from this process are unintentional.

## 2020-11-24 NOTE — Patient Instructions (Addendum)
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  He had some trouble tolerating Keppra and hence I switched him to Depakote which he also stopped but he is now willing to go back on it.  I recommend only a small dose of Depakote ER mg  500 mg once daily.  He was advised not to drive for 6 months since his episode of loss of consciousness in August this year.  He will return for follow-up in the future in 3 months or call earlier if necessary.

## 2020-12-03 DIAGNOSIS — R351 Nocturia: Secondary | ICD-10-CM | POA: Diagnosis not present

## 2020-12-03 DIAGNOSIS — N401 Enlarged prostate with lower urinary tract symptoms: Secondary | ICD-10-CM | POA: Diagnosis not present

## 2020-12-22 DIAGNOSIS — J029 Acute pharyngitis, unspecified: Secondary | ICD-10-CM | POA: Diagnosis not present

## 2020-12-22 DIAGNOSIS — B370 Candidal stomatitis: Secondary | ICD-10-CM | POA: Diagnosis not present

## 2020-12-22 DIAGNOSIS — Z20828 Contact with and (suspected) exposure to other viral communicable diseases: Secondary | ICD-10-CM | POA: Diagnosis not present

## 2021-02-01 ENCOUNTER — Other Ambulatory Visit: Payer: Self-pay | Admitting: Neurological Surgery

## 2021-02-01 DIAGNOSIS — S066X0A Traumatic subarachnoid hemorrhage without loss of consciousness, initial encounter: Secondary | ICD-10-CM

## 2021-02-02 ENCOUNTER — Other Ambulatory Visit: Payer: Self-pay

## 2021-02-02 ENCOUNTER — Ambulatory Visit
Admission: RE | Admit: 2021-02-02 | Discharge: 2021-02-02 | Disposition: A | Discharge status: Home / Self Care | Payer: Medicare PPO | Source: Ambulatory Visit | Attending: Neurological Surgery | Admitting: Neurological Surgery

## 2021-02-02 DIAGNOSIS — R030 Elevated blood-pressure reading, without diagnosis of hypertension: Secondary | ICD-10-CM | POA: Diagnosis not present

## 2021-02-02 DIAGNOSIS — S0990XA Unspecified injury of head, initial encounter: Secondary | ICD-10-CM | POA: Diagnosis not present

## 2021-02-02 DIAGNOSIS — S066X0A Traumatic subarachnoid hemorrhage without loss of consciousness, initial encounter: Secondary | ICD-10-CM

## 2021-02-02 DIAGNOSIS — Z6826 Body mass index (BMI) 26.0-26.9, adult: Secondary | ICD-10-CM | POA: Diagnosis not present

## 2021-02-02 DIAGNOSIS — R569 Unspecified convulsions: Secondary | ICD-10-CM | POA: Diagnosis not present

## 2021-02-16 ENCOUNTER — Other Ambulatory Visit: Payer: Self-pay | Admitting: *Deleted

## 2021-02-16 MED ORDER — DIVALPROEX SODIUM ER 500 MG PO TB24: 500.0000 mg | ORAL_TABLET | Freq: Every day | ORAL | 0 refills | Status: DC

## 2021-02-17 DIAGNOSIS — Z6828 Body mass index (BMI) 28.0-28.9, adult: Secondary | ICD-10-CM | POA: Diagnosis not present

## 2021-02-17 DIAGNOSIS — R21 Rash and other nonspecific skin eruption: Secondary | ICD-10-CM | POA: Diagnosis not present

## 2021-02-23 DIAGNOSIS — K219 Gastro-esophageal reflux disease without esophagitis: Secondary | ICD-10-CM | POA: Diagnosis not present

## 2021-02-23 DIAGNOSIS — E1159 Type 2 diabetes mellitus with other circulatory complications: Secondary | ICD-10-CM | POA: Diagnosis not present

## 2021-02-23 DIAGNOSIS — E7849 Other hyperlipidemia: Secondary | ICD-10-CM | POA: Diagnosis not present

## 2021-02-23 DIAGNOSIS — I10 Essential (primary) hypertension: Secondary | ICD-10-CM | POA: Diagnosis not present

## 2021-03-01 ENCOUNTER — Ambulatory Visit: Payer: Medicare PPO | Admitting: Neurology

## 2021-03-26 DIAGNOSIS — E1159 Type 2 diabetes mellitus with other circulatory complications: Secondary | ICD-10-CM | POA: Diagnosis not present

## 2021-03-26 DIAGNOSIS — K219 Gastro-esophageal reflux disease without esophagitis: Secondary | ICD-10-CM | POA: Diagnosis not present

## 2021-03-26 DIAGNOSIS — E7849 Other hyperlipidemia: Secondary | ICD-10-CM | POA: Diagnosis not present

## 2021-03-26 DIAGNOSIS — I10 Essential (primary) hypertension: Secondary | ICD-10-CM | POA: Diagnosis not present

## 2021-03-28 ENCOUNTER — Telehealth: Payer: Self-pay | Admitting: Neurology

## 2021-03-28 NOTE — Telephone Encounter (Signed)
Pt's wife called wanting to speak to the RN about the pt's headache's that he has been experiencing lately and about his confusion that seems to be getting worse. Please advise.

## 2021-03-28 NOTE — Telephone Encounter (Signed)
Returned patient's call and spoke to wife.  George Riley has had increased headaches in the middle of his head at the front, and increased confusion in the evening.  He had a CT completed 2 months ago in [place] and was told there were no more bleeding and Dr. Jake Samples couldn't give them a reasoning as to why he is sleeping more and disoriented.    He was seen by the PCP in the last couple months and told he didn't have a UTI or any kind of infection.  If there are any additional testing he needs, can he get it done in [place] so she doesn't have to drive him into [place].  Patient denied further questions, verbalized understanding and expressed appreciation for the phone call.

## 2021-03-28 NOTE — Telephone Encounter (Signed)
I have reviewed the patient's chart his CT scan from March is reassuring and does not show anything new or worrisome.  I believe the patient is on Depakote ER 500 mg daily for seizure prophylaxis.  I recommend we increase it to 1000 mg daily so it should help with headaches as well.  Ask him to call back in a week to see if this is helpful

## 2021-03-29 NOTE — Telephone Encounter (Signed)
Returned wife's call back and discussed Dr. Marlis Edelson recommendation.  She verbalized understanding and would call me back next week. If it is working well for patient, she will need his prescription changed and called in or he will run out early.    Patient denied further questions, verbalized understanding and expressed appreciation for the phone call.

## 2021-04-05 ENCOUNTER — Other Ambulatory Visit: Payer: Self-pay | Admitting: Emergency Medicine

## 2021-04-05 MED ORDER — DIVALPROEX SODIUM ER 500 MG PO TB24: 500.0000 mg | ORAL_TABLET | Freq: Two times a day (BID) | ORAL | 1 refills | Status: DC

## 2021-04-05 NOTE — Telephone Encounter (Signed)
Patient's wife called and stated that the increase in Depakote ER 500  To 1000 mg has made a world of difference.  There is a definite improvement in him.    Renewed prescription to reflect new dosage.  Patient denied further questions, verbalized understanding and expressed appreciation for the phone call.

## 2021-04-25 DIAGNOSIS — K219 Gastro-esophageal reflux disease without esophagitis: Secondary | ICD-10-CM | POA: Diagnosis not present

## 2021-04-25 DIAGNOSIS — E1159 Type 2 diabetes mellitus with other circulatory complications: Secondary | ICD-10-CM | POA: Diagnosis not present

## 2021-04-25 DIAGNOSIS — I10 Essential (primary) hypertension: Secondary | ICD-10-CM | POA: Diagnosis not present

## 2021-04-25 DIAGNOSIS — E7849 Other hyperlipidemia: Secondary | ICD-10-CM | POA: Diagnosis not present

## 2021-04-28 ENCOUNTER — Telehealth: Payer: Self-pay | Admitting: Neurology

## 2021-04-28 ENCOUNTER — Other Ambulatory Visit: Payer: Self-pay | Admitting: Emergency Medicine

## 2021-04-28 MED ORDER — DIVALPROEX SODIUM ER 500 MG PO TB24: 500.0000 mg | ORAL_TABLET | Freq: Two times a day (BID) | ORAL | 1 refills | Status: DC

## 2021-04-28 NOTE — Telephone Encounter (Signed)
Wife called and notified the pharmacy has been change

## 2021-04-28 NOTE — Telephone Encounter (Signed)
Pt's wife, Xavious Sharrar (on DPR) changing pharmacy to [facility] for his medication. Send refill for divalproex (DEPAKOTE ER) 500 MG 24 hr tablet to [facility], Ph# [phone]. Would like a call from the nurse to confirm pharmacy has been changed.

## 2021-05-04 ENCOUNTER — Encounter: Payer: Self-pay | Admitting: Neurology

## 2021-05-10 ENCOUNTER — Ambulatory Visit: Payer: Medicare PPO | Admitting: Neurology

## 2021-05-10 ENCOUNTER — Other Ambulatory Visit: Payer: Self-pay

## 2021-05-10 ENCOUNTER — Encounter: Payer: Self-pay | Admitting: Neurology

## 2021-05-10 ENCOUNTER — Telehealth: Payer: Self-pay | Admitting: Neurology

## 2021-05-10 VITALS — BP 151/75 | HR 61 | Ht 68.5 in | Wt 176.2 lb

## 2021-05-10 DIAGNOSIS — G3184 Mild cognitive impairment, so stated: Secondary | ICD-10-CM | POA: Diagnosis not present

## 2021-05-10 DIAGNOSIS — G40119 Localization-related (focal) (partial) symptomatic epilepsy and epileptic syndromes with simple partial seizures, intractable, without status epilepticus: Secondary | ICD-10-CM | POA: Diagnosis not present

## 2021-05-10 DIAGNOSIS — G40209 Localization-related (focal) (partial) symptomatic epilepsy and epileptic syndromes with complex partial seizures, not intractable, without status epilepticus: Secondary | ICD-10-CM | POA: Diagnosis not present

## 2021-05-10 MED ORDER — LEVETIRACETAM ER 750 MG PO TB24: 750.0000 mg | ORAL_TABLET | Freq: Every day | ORAL | 3 refills | Status: AC

## 2021-05-10 NOTE — Telephone Encounter (Signed)
[employer] Berkley Harvey: [id number] exp 06/09/21 scheduled at [facility] 05/25/21 at 1PM.

## 2021-05-10 NOTE — Patient Instructions (Signed)
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  Patient also appears to be having possible side effects and Depakote ER 1000 mg a day hence I recommend checking level today as well as ammonia level and discontinuing it and replacing it with Keppra XR 750 mg daily instead.  Also check EEG for silent seizures as well as MRI scan of the brain.  He will return for follow-up in 2 months or call earlier if necessary.

## 2021-05-10 NOTE — Progress Notes (Signed)
[facility] [address] [place]. [place] [address] [phone]       OFFICE FOLLOW UP VISIT NOTE  [name] Date of Birth:  Feb 22, 1946 Medical Record Number:  [id number]   Referring MD:  Kendell Bane Dawley  Dose  Reason for Referral: Seizures and subarachnoid hemorrhage  CBS:[id number] visit 09/02/2020 : George Riley is a pleasant 75 year old Caucasian male seen today for initial office consultation visit.  Is accompanied by his wife.  History is obtained from them and review of referral notes.  I have reviewed available pertinent imaging films in PACS.  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  He had an episode in August 2021 when he fell down at home in the driveway on the concrete.  It was an unwitnessed fall the patient's wife noticed that he was trying to get up and he had a right parietal abrasion scalp hematoma with bleeding on the ground.  He did not lose consciousness.  He was taken to [facility] from where he was transferred to [facility] where he was admitted for 2 3 days.  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  He did not require any surgical intervention.  Patient was discharged home and on the way was noted by wife to have seizures.  He had been started on Keppra during the hospitalization but did not have a prescription for it after discharge.  He was subsequently seen at [facility] and CT scan of the head on 07/18/2020 which I have reviewed shows left convexity subarachnoid hemorrhage is reported suppose to show no significant change compared with the one done in [place] 5 days ago.  I do not have access to the records at [facility] and do not know if a CT angiogram and CT venogram were performed.  Patient for started on Keppra 500 twice daily at [facility] and has not had any subsequent seizures.   He was seen by neurosurgeon Dr. Jake Samples who referred the patient to [place] for seizure management.  Patient states he has had no further weakness generalized tonic-clonic seizures but did have a few episodes of intermittent right upper extremity paresthesias.  He is feels he is tolerating Keppra and the current dose well though he feels a little sleepy.  He denies any dizziness or changes in his personality.  He does have however short-term memory difficulties ever since his brain hemorrhage which have not improved.  He has trouble remembering recent information.  He can remember things in the past quite well.  He denies any significant headaches gait or balance problems.  He has no family history of brain aneurysms. Update 11/24/20 : He returns for follow-up after last visit to end of months ago.  Is accompanied by his wife.  Patient states he had trouble tolerating Keppra and complained of dizziness and head pressure and hence was switched to Depakote ER but he had trouble with that as well and stopped that too.  However he states the pressure in his head is still there but the dizziness is improved.  He said no further episodes of paresthesias or seizure-like episodes.  He denies to headaches but states that his head feels heavy and this feeling can come and go without warning.  There are no specific triggers or relieving factors.  This is not annoying but does bother him slightly.  He did undergo lab work  at last visit and vitamin B12, TSH, RPR and homocystine were all normal.  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  CT venogram was normal.  EEG done on 09/28/2020 was normal. Update 05/10/2021 ; he returns for follow-up after last visit 05 November 2020.  He is accompanied by his wife who states that they have noticed worsening of his memory and cognitive difficulties in the last few months.  He has increased trouble finding words.  She also found him  on a couple of occasions to be staring and disoriented and not responsive to her.  Patient was started on Keppra for seizures following his subarachnoid hemorrhage with some side effects and I switched him to Depakote.  He has been on Depakote ER now 1000 mg a day for the last couple of months but family family has noticed some increased hallucinations and tremors in his hands.  He has not had any valproic acid level checked.  He did have a CT scan of the head done on 02/02/2021 by Dr. 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  No acute findings.  On cognitive testing today scored 0/3 on recall and could name only 4 animals which can walk on 4 legs and clock drawing was 3/4. ROS:   14 system review of systems is positive for memory loss, difficulty with concentration, headache, seizures, loss of consciousness, paresthesias, tingling numbness PMH:  Past Medical History:  Diagnosis Date   History of heart artery stent 2000, 2021   MI (myocardial infarction) (HCC) 1998   Triple Bypass   Seizures (HCC)    started with stroke    Social History:  Social History   Socioeconomic History   Marital status: Married    Spouse name: Latanya Presser   Number of children: 2   Years of education: Not on file   Highest education level: Not on file  Occupational History   Occupation: retired  Tobacco Use   Smoking status: Former    Pack years: 0.00    Types: Cigarettes    Quit date: 2001    Years since quitting: 21.4   Smokeless tobacco: Never  Substance and Sexual Activity   Alcohol use: Never   Drug use: Never   Sexual activity: Not on file  Other Topics Concern   Not on file  Social History Narrative   Lives with spouse   Right handed   Drinks 5+ cups of caffeine daily   Social Determinants of Health   Financial Resource Strain: Not on file  Food Insecurity: Not on file  Transportation Needs: Not on file  Physical Activity: Not on file  Stress: Not  on file  Social Connections: Not on file  Intimate Partner Violence: Not on file    Medications:   Current Outpatient Medications on File Prior to Visit  Medication Sig Dispense Refill   aspirin EC 81 MG tablet Take 81 mg by mouth daily. Swallow whole.     escitalopram (LEXAPRO) 20 MG tablet Take 20 mg by mouth daily.     nitroGLYCERIN (NITROSTAT) 0.4 MG SL tablet Place 0.4 mg under the tongue every 5 (five) minutes as needed for chest pain.     omeprazole (PRILOSEC) 20 MG capsule Take 20 mg by mouth daily.     rosuvastatin (CRESTOR) 20 MG tablet Take 20 mg by mouth at bedtime.     tamsulosin (FLOMAX) 0.4 MG CAPS capsule Take 0.4 mg  by mouth.     No current facility-administered medications on file prior to visit.    Allergies:   Allergies  Allergen Reactions   Tetracycline     Physical Exam General: well developed, well nourished elderly Caucasian male, seated, in no evident distress Head: head normocephalic and atraumatic.   Neck: supple with no carotid or supraclavicular bruits Cardiovascular: regular rate and rhythm, no murmurs Musculoskeletal: no deformity Skin:  no rash/petichiae Vascular:  Normal pulses all extremities  Neurologic Exam Mental Status: Awake and fully alert. Oriented to place and time. Recent and remote memory intact. Attention span, concentration and fund of knowledge appropriate. Mood and affect appropriate.  Diminished recall 0/3.  Mini-Mental status exam not done today ( last visit 09/02/20 he scored 25/30 with deficits in orientation and attention calculation.)  Clock drawing 3/4.  Able to name only 4 animals which can walk on 4 legs. Cranial Nerves: Fundoscopic exam not done Pupils equal, briskly reactive to light. Extraocular movements full without nystagmus. Visual fields full to confrontation. Hearing intact. Facial sensation intact. Face, tongue, palate moves normally and symmetrically.  Motor: Normal bulk and tone. Normal strength in all tested  extremity muscles. Sensory.: intact to touch , pinprick , position and vibratory sensation.  Coordination: Rapid alternating movements normal in all extremities. Finger-to-nose and heel-to-shin performed accurately bilaterally. Gait and Station: Arises from chair without difficulty. Stance is normal. Gait demonstrates normal stride length and balance . Able to heel, toe and tandem walk with slight difficulty.  Reflexes: 1+ and symmetric. Toes downgoing.      ASSESSMENT: 75 year old Caucasian male with presumed traumatic left frontal convexity subarachnoid hemorrhage in August 2021 with symptomatic seizures mild cognitive impairment.  He has had some cognitive worsening as well as episodes of staring in recent months which may represent breakthrough seizures and he may have possible side effects from Depakote.   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  Patient also appears to be having possible side effects and Depakote ER 1000 mg a day hence I recommend checking level today as well as ammonia level and discontinuing it and replacing it with Keppra XR 750 mg daily instead.  Also check EEG for silent seizures as well as MRI scan of the brain.  He will return for follow-up in 2 months or call earlier if necessary.  Greater than 50% time during this 35-minute   visit was spent in counseling and coordination of care about his traumatic subarachnoid hemorrhage and symptomatic seizures and mild cognitive impairment and answering questions.   Delia Heady, MD Note: This document was prepared with digital dictation and possible smart phrase technology. Any transcriptional errors that result from this process are unintentional.

## 2021-05-11 LAB — AMMONIA: Ammonia: 40 ug/dL (ref 31–169)

## 2021-05-11 LAB — VALPROIC ACID LEVEL: Valproic Acid Lvl: 70 ug/mL (ref 50–100)

## 2021-05-11 NOTE — Telephone Encounter (Signed)
I spoke with patient's wife and she agreed to MRI at [facility] 05/25/21 check-in at 12:45PM.

## 2021-05-16 DIAGNOSIS — E782 Mixed hyperlipidemia: Secondary | ICD-10-CM | POA: Diagnosis not present

## 2021-05-16 DIAGNOSIS — M79604 Pain in right leg: Secondary | ICD-10-CM | POA: Diagnosis not present

## 2021-05-16 DIAGNOSIS — I2510 Atherosclerotic heart disease of native coronary artery without angina pectoris: Secondary | ICD-10-CM | POA: Diagnosis not present

## 2021-05-16 DIAGNOSIS — M79605 Pain in left leg: Secondary | ICD-10-CM | POA: Diagnosis not present

## 2021-05-16 DIAGNOSIS — I10 Essential (primary) hypertension: Secondary | ICD-10-CM | POA: Diagnosis not present

## 2021-05-18 ENCOUNTER — Ambulatory Visit: Payer: Medicare PPO | Admitting: Neurology

## 2021-05-18 DIAGNOSIS — G40119 Localization-related (focal) (partial) symptomatic epilepsy and epileptic syndromes with simple partial seizures, intractable, without status epilepticus: Secondary | ICD-10-CM

## 2021-05-23 ENCOUNTER — Telehealth: Payer: Self-pay | Admitting: *Deleted

## 2021-05-23 NOTE — Progress Notes (Signed)
Kindly inform the patient that EEG study was normal

## 2021-05-23 NOTE — Progress Notes (Signed)
Kindly inform the patient that valproic acid level and ammonia levels were both in the acceptable normal range

## 2021-05-23 NOTE — Telephone Encounter (Signed)
-----   Message from Micki Riley, MD sent at 05/23/2021  5:09 PM EDT ----- Joneen Roach inform the patient that valproic acid level and ammonia levels were both in the acceptable normal range

## 2021-05-23 NOTE — Telephone Encounter (Signed)
-----   Message from Micki Riley, MD sent at 05/23/2021  5:04 PM EDT ----- George Riley inform the patient that EEG study was normal

## 2021-05-23 NOTE — Telephone Encounter (Signed)
I spoke to his wife and provided her with the lab results.

## 2021-05-23 NOTE — Telephone Encounter (Signed)
I spoke to his wife and provided her with the EEG results.  

## 2021-05-25 ENCOUNTER — Other Ambulatory Visit: Payer: Self-pay

## 2021-05-25 ENCOUNTER — Ambulatory Visit (HOSPITAL_COMMUNITY)
Admission: RE | Admit: 2021-05-25 | Discharge: 2021-05-25 | Disposition: A | Discharge status: Home / Self Care | Payer: Medicare PPO | Source: Ambulatory Visit | Attending: Neurology | Admitting: Neurology

## 2021-05-25 DIAGNOSIS — G3184 Mild cognitive impairment, so stated: Secondary | ICD-10-CM | POA: Insufficient documentation

## 2021-05-25 DIAGNOSIS — G40119 Localization-related (focal) (partial) symptomatic epilepsy and epileptic syndromes with simple partial seizures, intractable, without status epilepticus: Secondary | ICD-10-CM | POA: Diagnosis not present

## 2021-05-25 DIAGNOSIS — R569 Unspecified convulsions: Secondary | ICD-10-CM | POA: Diagnosis not present

## 2021-05-25 MED ORDER — GADOBUTROL 1 MMOL/ML IV SOLN
8.0000 mL | Freq: Once | INTRAVENOUS | Status: AC | PRN
  Administered 2021-05-25: 8 mL via INTRAVENOUS

## 2021-05-26 ENCOUNTER — Telehealth: Payer: Self-pay | Admitting: Neurology

## 2021-05-26 DIAGNOSIS — I10 Essential (primary) hypertension: Secondary | ICD-10-CM | POA: Diagnosis not present

## 2021-05-26 DIAGNOSIS — E7849 Other hyperlipidemia: Secondary | ICD-10-CM | POA: Diagnosis not present

## 2021-05-26 DIAGNOSIS — E1159 Type 2 diabetes mellitus with other circulatory complications: Secondary | ICD-10-CM | POA: Diagnosis not present

## 2021-05-26 DIAGNOSIS — K219 Gastro-esophageal reflux disease without esophagitis: Secondary | ICD-10-CM | POA: Diagnosis not present

## 2021-05-26 NOTE — Telephone Encounter (Signed)
Pt's wife called wanting to inform the RN that when the MRI results come in she is needing to be called on [phone] she states that if the land line is called the pt will not be able to relay the message. Please advise.

## 2021-05-27 NOTE — Progress Notes (Signed)
Kindly inform the patient that MRI scan of the brain shows age-related changes of hardening of the arteries.  There is mild staining of the surface of the brain from old blood products related to his previous brain hemorrhage.  No new or worrisome finding.

## 2021-05-31 ENCOUNTER — Telehealth: Payer: Self-pay | Admitting: *Deleted

## 2021-05-31 NOTE — Telephone Encounter (Signed)
I spoke to his wife on DPR and provided her with the results. She verbalized understanding.

## 2021-05-31 NOTE — Telephone Encounter (Signed)
-----   Message from Micki Riley, MD sent at 05/27/2021  4:06 PM EDT ----- Joneen Roach inform the patient that MRI scan of the brain shows age-related changes of hardening of the arteries.  There is mild staining of the surface of the brain from old blood products related to his previous brain hemorrhage.  No new or worrisome finding.

## 2021-06-08 ENCOUNTER — Ambulatory Visit: Payer: Medicare PPO | Admitting: Neurology

## 2021-06-13 DIAGNOSIS — S065X9S Traumatic subdural hemorrhage with loss of consciousness of unspecified duration, sequela: Secondary | ICD-10-CM | POA: Diagnosis not present

## 2021-06-13 DIAGNOSIS — R2689 Other abnormalities of gait and mobility: Secondary | ICD-10-CM | POA: Diagnosis not present

## 2021-06-16 DIAGNOSIS — R2689 Other abnormalities of gait and mobility: Secondary | ICD-10-CM | POA: Diagnosis not present

## 2021-06-16 DIAGNOSIS — S065X9S Traumatic subdural hemorrhage with loss of consciousness of unspecified duration, sequela: Secondary | ICD-10-CM | POA: Diagnosis not present

## 2021-06-20 DIAGNOSIS — R2689 Other abnormalities of gait and mobility: Secondary | ICD-10-CM | POA: Diagnosis not present

## 2021-06-20 DIAGNOSIS — S065X9S Traumatic subdural hemorrhage with loss of consciousness of unspecified duration, sequela: Secondary | ICD-10-CM | POA: Diagnosis not present

## 2021-06-23 DIAGNOSIS — R2689 Other abnormalities of gait and mobility: Secondary | ICD-10-CM | POA: Diagnosis not present

## 2021-06-23 DIAGNOSIS — S065X9S Traumatic subdural hemorrhage with loss of consciousness of unspecified duration, sequela: Secondary | ICD-10-CM | POA: Diagnosis not present

## 2021-06-26 DIAGNOSIS — E7849 Other hyperlipidemia: Secondary | ICD-10-CM | POA: Diagnosis not present

## 2021-06-26 DIAGNOSIS — I10 Essential (primary) hypertension: Secondary | ICD-10-CM | POA: Diagnosis not present

## 2021-06-26 DIAGNOSIS — E1159 Type 2 diabetes mellitus with other circulatory complications: Secondary | ICD-10-CM | POA: Diagnosis not present

## 2021-06-26 DIAGNOSIS — K219 Gastro-esophageal reflux disease without esophagitis: Secondary | ICD-10-CM | POA: Diagnosis not present

## 2021-06-27 DIAGNOSIS — S065X9S Traumatic subdural hemorrhage with loss of consciousness of unspecified duration, sequela: Secondary | ICD-10-CM | POA: Diagnosis not present

## 2021-06-27 DIAGNOSIS — R2689 Other abnormalities of gait and mobility: Secondary | ICD-10-CM | POA: Diagnosis not present

## 2021-06-30 DIAGNOSIS — S065X9S Traumatic subdural hemorrhage with loss of consciousness of unspecified duration, sequela: Secondary | ICD-10-CM | POA: Diagnosis not present

## 2021-06-30 DIAGNOSIS — R2689 Other abnormalities of gait and mobility: Secondary | ICD-10-CM | POA: Diagnosis not present

## 2021-07-04 DIAGNOSIS — R2689 Other abnormalities of gait and mobility: Secondary | ICD-10-CM | POA: Diagnosis not present

## 2021-07-04 DIAGNOSIS — S065X9S Traumatic subdural hemorrhage with loss of consciousness of unspecified duration, sequela: Secondary | ICD-10-CM | POA: Diagnosis not present

## 2021-07-11 DIAGNOSIS — E7849 Other hyperlipidemia: Secondary | ICD-10-CM | POA: Diagnosis not present

## 2021-07-11 DIAGNOSIS — R739 Hyperglycemia, unspecified: Secondary | ICD-10-CM | POA: Diagnosis not present

## 2021-07-11 DIAGNOSIS — K219 Gastro-esophageal reflux disease without esophagitis: Secondary | ICD-10-CM | POA: Diagnosis not present

## 2021-07-11 DIAGNOSIS — R2689 Other abnormalities of gait and mobility: Secondary | ICD-10-CM | POA: Diagnosis not present

## 2021-07-11 DIAGNOSIS — S065X9S Traumatic subdural hemorrhage with loss of consciousness of unspecified duration, sequela: Secondary | ICD-10-CM | POA: Diagnosis not present

## 2021-07-11 DIAGNOSIS — E782 Mixed hyperlipidemia: Secondary | ICD-10-CM | POA: Diagnosis not present

## 2021-07-11 DIAGNOSIS — I10 Essential (primary) hypertension: Secondary | ICD-10-CM | POA: Diagnosis not present

## 2021-07-14 DIAGNOSIS — S065X9A Traumatic subdural hemorrhage with loss of consciousness of unspecified duration, initial encounter: Secondary | ICD-10-CM | POA: Diagnosis not present

## 2021-07-14 DIAGNOSIS — R2689 Other abnormalities of gait and mobility: Secondary | ICD-10-CM | POA: Diagnosis not present

## 2021-07-14 DIAGNOSIS — G40119 Localization-related (focal) (partial) symptomatic epilepsy and epileptic syndromes with simple partial seizures, intractable, without status epilepticus: Secondary | ICD-10-CM | POA: Diagnosis not present

## 2021-07-14 DIAGNOSIS — I10 Essential (primary) hypertension: Secondary | ICD-10-CM | POA: Diagnosis not present

## 2021-07-14 DIAGNOSIS — G3184 Mild cognitive impairment, so stated: Secondary | ICD-10-CM | POA: Diagnosis not present

## 2021-07-14 DIAGNOSIS — I609 Nontraumatic subarachnoid hemorrhage, unspecified: Secondary | ICD-10-CM | POA: Diagnosis not present

## 2021-07-14 DIAGNOSIS — K219 Gastro-esophageal reflux disease without esophagitis: Secondary | ICD-10-CM | POA: Diagnosis not present

## 2021-07-14 DIAGNOSIS — E7849 Other hyperlipidemia: Secondary | ICD-10-CM | POA: Diagnosis not present

## 2021-07-14 DIAGNOSIS — S065X9S Traumatic subdural hemorrhage with loss of consciousness of unspecified duration, sequela: Secondary | ICD-10-CM | POA: Diagnosis not present

## 2021-07-14 DIAGNOSIS — E1159 Type 2 diabetes mellitus with other circulatory complications: Secondary | ICD-10-CM | POA: Diagnosis not present

## 2021-07-15 ENCOUNTER — Other Ambulatory Visit: Payer: Self-pay | Admitting: Neurology

## 2021-07-19 ENCOUNTER — Ambulatory Visit: Payer: Medicare PPO | Admitting: Neurology

## 2021-08-05 ENCOUNTER — Telehealth: Payer: Self-pay | Admitting: Neurology

## 2021-08-05 NOTE — Telephone Encounter (Signed)
Pt's wife Fannie Knee called wanting to know if the doctor could prescribe something for her husband to help him sleep. Says he is not sleeping at night and is seeing things throughout the day but it gets worse at night. Fannie Knee is requesting a call back.

## 2021-08-08 NOTE — Telephone Encounter (Signed)
Pt's wife, Montae Stager (on [place]) called, he is disoriented, night having hallucinations, confusion, has started wandering at night. Have changed the locks, he still gets out. Can you prescribe something for the hallucinations. Would like a call from the nurse.

## 2021-08-08 NOTE — Telephone Encounter (Signed)
Returned wife's call.  She believes the side effects from the Keppra is what is causing a lot of the hallucinations and wandering.  She said he doesn't know what he is doing when this starts and cannot reorient him to stop.Happens all during night.    She needs advice and something for what is going on.  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

## 2021-08-09 ENCOUNTER — Telehealth: Payer: Self-pay | Admitting: Neurology

## 2021-08-09 NOTE — Telephone Encounter (Signed)
Pt's sgo called stating that they were supposed to be getting a call from the physician and have not received it. Please advise.

## 2021-08-09 NOTE — Telephone Encounter (Signed)
Dr. Pearlean Brownie is aware the patient's wife would like a call back. Our office called back to let her know he has the message (separate phone note in Epic).

## 2021-08-09 NOTE — Telephone Encounter (Signed)
Patient's wife called again. She is aware to expect a call back from Dr. Pearlean Brownie.

## 2021-09-01 DIAGNOSIS — N509 Disorder of male genital organs, unspecified: Secondary | ICD-10-CM | POA: Diagnosis not present

## 2021-09-12 ENCOUNTER — Ambulatory Visit: Payer: Medicare PPO | Admitting: Neurology

## 2021-10-10 DIAGNOSIS — I10 Essential (primary) hypertension: Secondary | ICD-10-CM | POA: Diagnosis not present

## 2021-10-10 DIAGNOSIS — E1159 Type 2 diabetes mellitus with other circulatory complications: Secondary | ICD-10-CM | POA: Diagnosis not present

## 2021-10-10 DIAGNOSIS — I609 Nontraumatic subarachnoid hemorrhage, unspecified: Secondary | ICD-10-CM | POA: Diagnosis not present

## 2021-10-10 DIAGNOSIS — G40119 Localization-related (focal) (partial) symptomatic epilepsy and epileptic syndromes with simple partial seizures, intractable, without status epilepticus: Secondary | ICD-10-CM | POA: Diagnosis not present

## 2021-10-10 DIAGNOSIS — Z23 Encounter for immunization: Secondary | ICD-10-CM | POA: Diagnosis not present

## 2021-10-10 DIAGNOSIS — G3184 Mild cognitive impairment, so stated: Secondary | ICD-10-CM | POA: Diagnosis not present

## 2021-10-10 DIAGNOSIS — S065X9A Traumatic subdural hemorrhage with loss of consciousness of unspecified duration, initial encounter: Secondary | ICD-10-CM | POA: Diagnosis not present

## 2021-10-10 DIAGNOSIS — F0390 Unspecified dementia without behavioral disturbance: Secondary | ICD-10-CM | POA: Diagnosis not present

## 2021-10-25 DIAGNOSIS — R413 Other amnesia: Secondary | ICD-10-CM | POA: Diagnosis not present

## 2021-10-25 DIAGNOSIS — G40209 Localization-related (focal) (partial) symptomatic epilepsy and epileptic syndromes with complex partial seizures, not intractable, without status epilepticus: Secondary | ICD-10-CM | POA: Diagnosis not present

## 2021-11-23 DIAGNOSIS — R413 Other amnesia: Secondary | ICD-10-CM | POA: Diagnosis not present

## 2021-11-25 DIAGNOSIS — E7849 Other hyperlipidemia: Secondary | ICD-10-CM | POA: Diagnosis not present

## 2021-11-25 DIAGNOSIS — K219 Gastro-esophageal reflux disease without esophagitis: Secondary | ICD-10-CM | POA: Diagnosis not present

## 2021-11-25 DIAGNOSIS — E1159 Type 2 diabetes mellitus with other circulatory complications: Secondary | ICD-10-CM | POA: Diagnosis not present

## 2021-11-25 DIAGNOSIS — I10 Essential (primary) hypertension: Secondary | ICD-10-CM | POA: Diagnosis not present

## 2021-12-01 DIAGNOSIS — R350 Frequency of micturition: Secondary | ICD-10-CM | POA: Diagnosis not present

## 2021-12-01 DIAGNOSIS — R351 Nocturia: Secondary | ICD-10-CM | POA: Diagnosis not present

## 2021-12-01 DIAGNOSIS — N401 Enlarged prostate with lower urinary tract symptoms: Secondary | ICD-10-CM | POA: Diagnosis not present

## 2022-01-24 DIAGNOSIS — R413 Other amnesia: Secondary | ICD-10-CM | POA: Diagnosis not present

## 2022-01-25 DIAGNOSIS — H903 Sensorineural hearing loss, bilateral: Secondary | ICD-10-CM | POA: Diagnosis not present

## 2022-01-25 DIAGNOSIS — Z8673 Personal history of transient ischemic attack (TIA), and cerebral infarction without residual deficits: Secondary | ICD-10-CM | POA: Diagnosis not present

## 2022-01-25 DIAGNOSIS — R42 Dizziness and giddiness: Secondary | ICD-10-CM | POA: Diagnosis not present

## 2022-02-22 DIAGNOSIS — E1159 Type 2 diabetes mellitus with other circulatory complications: Secondary | ICD-10-CM | POA: Diagnosis not present

## 2022-02-22 DIAGNOSIS — E7849 Other hyperlipidemia: Secondary | ICD-10-CM | POA: Diagnosis not present

## 2022-02-22 DIAGNOSIS — Z1329 Encounter for screening for other suspected endocrine disorder: Secondary | ICD-10-CM | POA: Diagnosis not present

## 2022-02-22 DIAGNOSIS — R7309 Other abnormal glucose: Secondary | ICD-10-CM | POA: Diagnosis not present

## 2022-02-22 DIAGNOSIS — I10 Essential (primary) hypertension: Secondary | ICD-10-CM | POA: Diagnosis not present

## 2022-02-22 DIAGNOSIS — R739 Hyperglycemia, unspecified: Secondary | ICD-10-CM | POA: Diagnosis not present

## 2022-02-22 DIAGNOSIS — E782 Mixed hyperlipidemia: Secondary | ICD-10-CM | POA: Diagnosis not present

## 2022-03-15 DIAGNOSIS — R42 Dizziness and giddiness: Secondary | ICD-10-CM | POA: Diagnosis not present

## 2022-04-04 DIAGNOSIS — I10 Essential (primary) hypertension: Secondary | ICD-10-CM | POA: Diagnosis not present

## 2022-04-04 DIAGNOSIS — G40119 Localization-related (focal) (partial) symptomatic epilepsy and epileptic syndromes with simple partial seizures, intractable, without status epilepticus: Secondary | ICD-10-CM | POA: Diagnosis not present

## 2022-04-04 DIAGNOSIS — I729 Aneurysm of unspecified site: Secondary | ICD-10-CM | POA: Diagnosis not present

## 2022-04-04 DIAGNOSIS — E7849 Other hyperlipidemia: Secondary | ICD-10-CM | POA: Diagnosis not present

## 2022-04-04 DIAGNOSIS — Z6826 Body mass index (BMI) 26.0-26.9, adult: Secondary | ICD-10-CM | POA: Diagnosis not present

## 2022-04-04 DIAGNOSIS — G3184 Mild cognitive impairment, so stated: Secondary | ICD-10-CM | POA: Diagnosis not present

## 2022-04-04 DIAGNOSIS — E1159 Type 2 diabetes mellitus with other circulatory complications: Secondary | ICD-10-CM | POA: Diagnosis not present

## 2022-04-04 DIAGNOSIS — S0990XA Unspecified injury of head, initial encounter: Secondary | ICD-10-CM | POA: Diagnosis not present

## 2022-05-01 DIAGNOSIS — R413 Other amnesia: Secondary | ICD-10-CM | POA: Diagnosis not present

## 2022-05-16 IMAGING — MR MR HEAD WO/W CM
13 of 14 series · 37 of 48 positions shown · IV contrast (gadavist)
Comparison: Head CTs 02/02/2021 and earlier. Outside brain MRI
09/10/2020.

CLINICAL DATA: 74-year-old male with partial symptomatic epilepsy.
Simple partial seizures. Mild cognitive impairment. Seizures for
over 1 year. Posttraumatic subarachnoid hemorrhage in [REDACTED] last
year.

EXAM:
MRI HEAD WITHOUT AND WITH CONTRAST
TECHNIQUE: Multiplanar, multiecho pulse sequences of the brain and surrounding
structures were obtained without and with intravenous contrast.
CONTRAST:  8mL GADAVIST GADOBUTROL 1 MMOL/ML IV SOLN

[Series 5: DWI · axial · 3.0mm · 0.77mm/px · z∈[-44,+102]mm · 2 of 50 slices shown (1 of 4)]
[im 1/50]
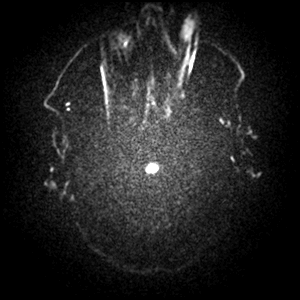
[im 50/50]
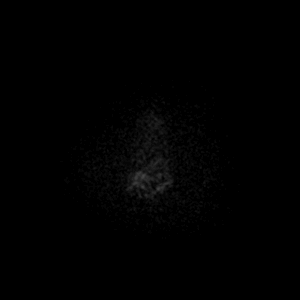

[Series 6: DWI · axial · 3.0mm · 0.77mm/px · z∈[-44,+102]mm · 2 of 50 slices shown (2 of 4)]
[im 1/50]
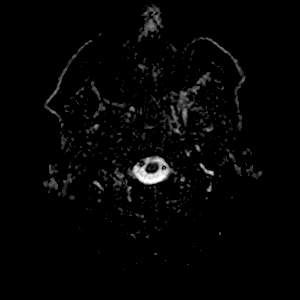
[im 50/50]
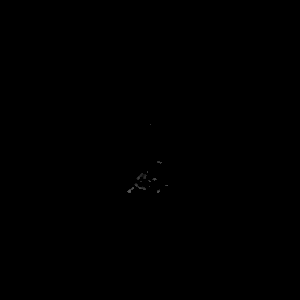

[Series 7: DWI · coronal · 5.0mm · 0.88mm/px · 2 of 28 slices shown (3 of 4)]
[im 1/28]
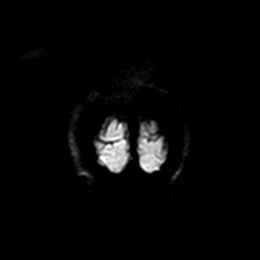
[im 28/28]
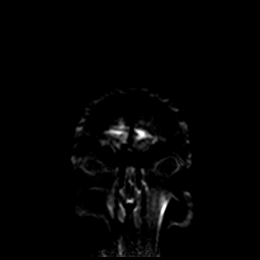

[Series 8: DWI · coronal · 5.0mm · 0.88mm/px · 2 of 28 slices shown (4 of 4)]
[im 1/28]
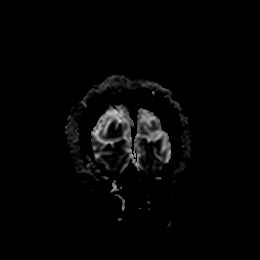
[im 28/28]
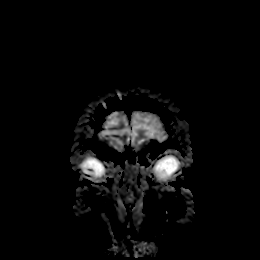

[Series 9: T1 · sagittal · 5.0mm · 0.75mm/px · 1 of 21 slices shown (1 of 2)]
[im 1/21]
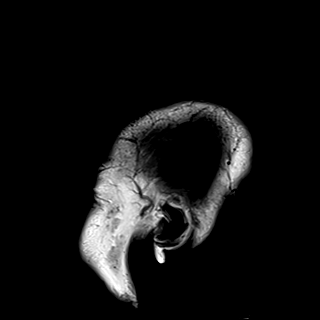

[Series 10: T2 · axial · 5.0mm · 0.72mm/px · 1 of 23 slices shown (1 of 3)]
[im 1/23]
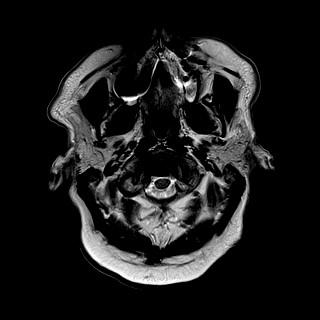

[Series 12: pha_images · axial · 3.0mm · 0.90mm/px · z∈[-54,+110]mm · 4 of 56 slices shown]
[im 1/56]
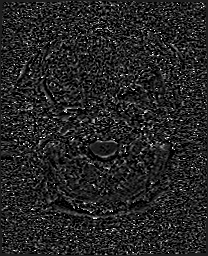
[im 19/56]
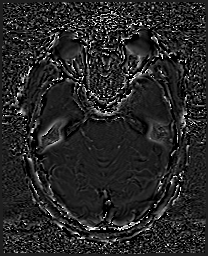
[im 37/56]
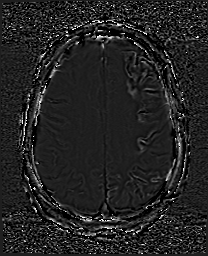
[im 56/56]
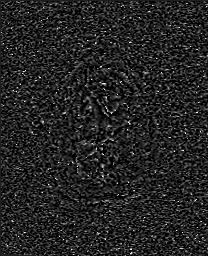

[Series 13: swi_images · axial · 3.0mm · 0.90mm/px · z∈[-57,+119]mm · 4 of 60 slices shown]
[im 1/60]
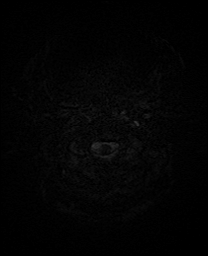
[im 20/60]
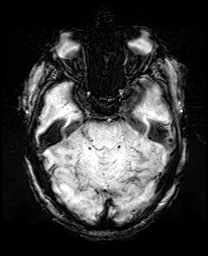
[im 40/60]
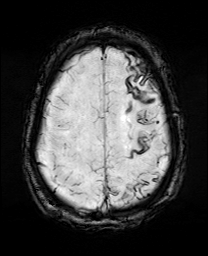
[im 60/60]
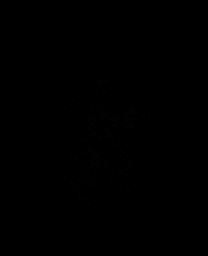

[Series 16: T1 · axial · 1.0mm · 0.98mm/px · z∈[-56,+117]mm · 11 of 173 slices shown (2 of 2)]
[im 1/173]
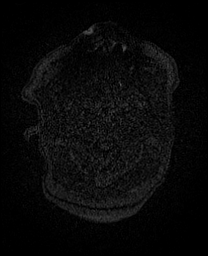
[im 18/173]
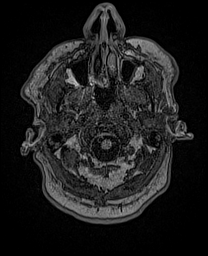
[im 35/173]
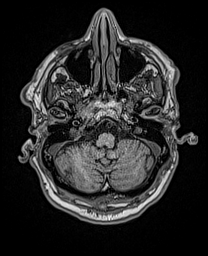
[im 52/173]
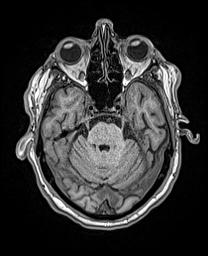
[im 69/173]
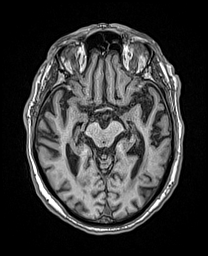
[im 87/173]
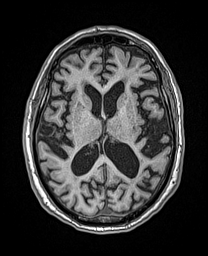
[im 104/173]
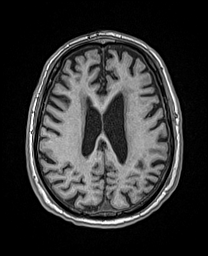
[im 121/173]
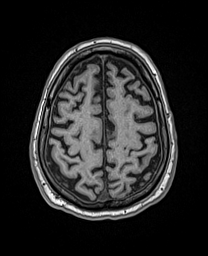
[im 138/173]
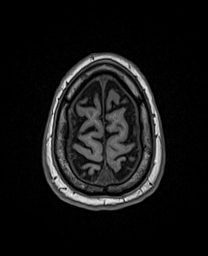
[im 155/173]
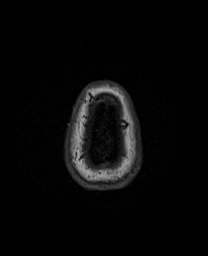
[im 173/173]
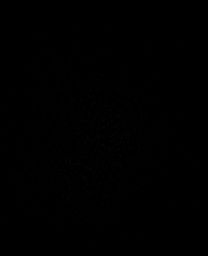

[Series 17: FLAIR · coronal · 3.0mm · 0.56mm/px · 2 of 25 slices shown]
[im 1/25]
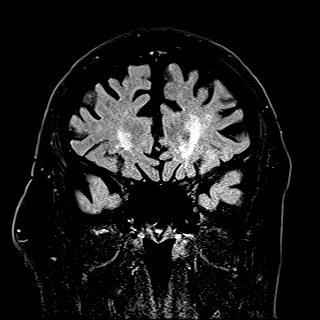
[im 25/25]
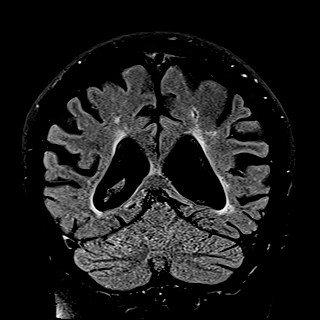

[Series 18: T2 · coronal · 3.0mm · 0.27mm/px · 2 of 25 slices shown (2 of 3)]
[im 1/25]
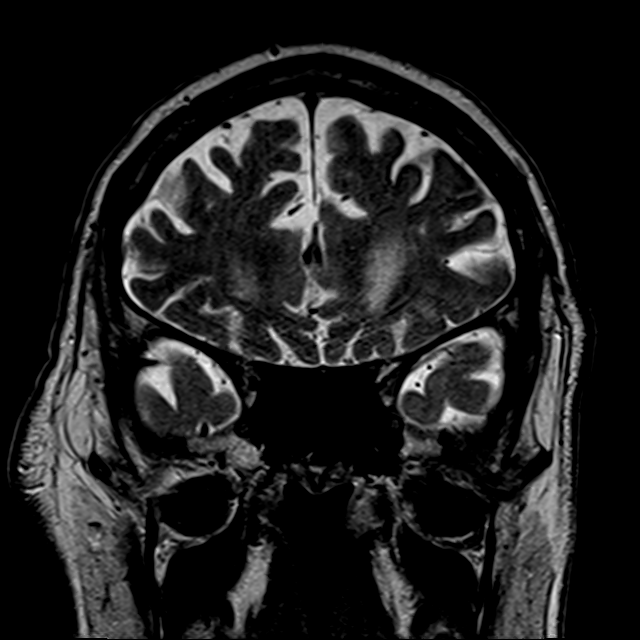
[im 25/25]
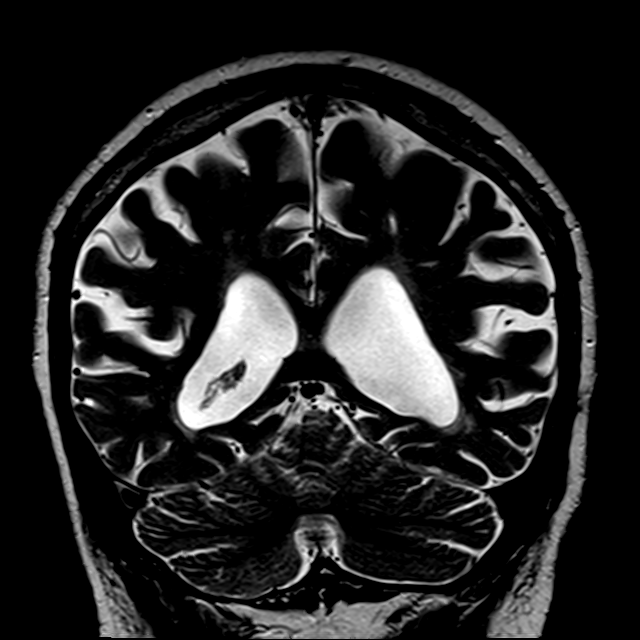

[Series 19: T2 · coronal · 5.0mm · 0.72mm/px · 2 of 28 slices shown (3 of 3)]
[im 1/28]
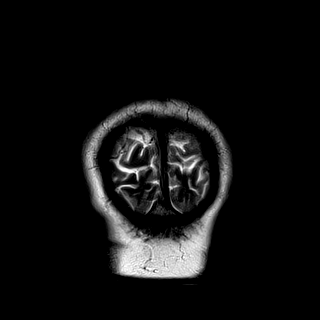
[im 28/28]
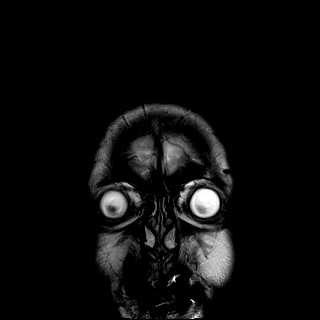

[Series 21: T1 post-contrast · coronal · 5.0mm · 0.34mm/px · 2 of 28 slices shown]
[im 1/28]
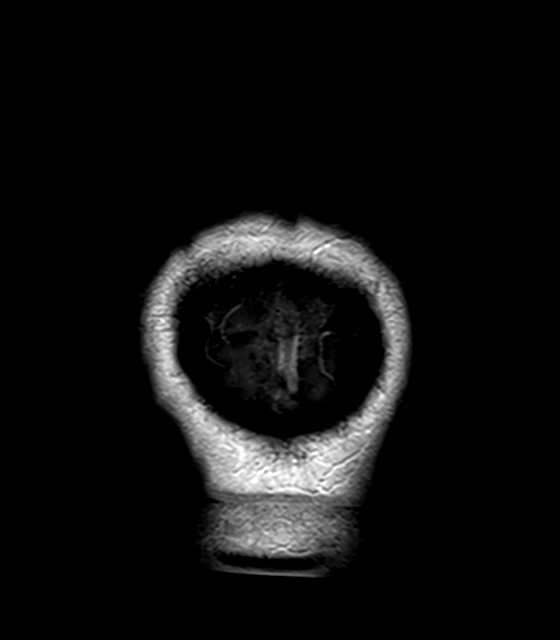
[im 28/28]
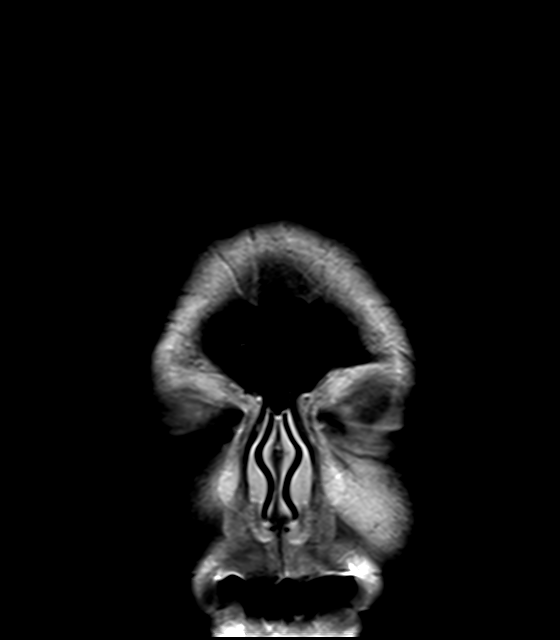

[37 of 48 positions shown; findings below may reference images not displayed]

FINDINGS: Brain: Fairly abundant superficial siderosis about the left
hemisphere (series 13, image 39), partially corresponding to the
pattern of subarachnoid hemorrhage seen in [REDACTED]. This is much more
conspicuous than on the outside T2 * imaging last year. Additional
bilateral posterior temporal lobe and lateral occipital lobe
siderosis (series 13, image 24). Siderosis also in the bilateral
parieto-occipital sulci. And there are fairly numerous superimposed
parenchymal chronic microhemorrhages - these can be identified in
the bilateral cerebellum and vermis, mesial left temporal lobe
(series 13, image 22), occipital lobes, and left parietal lobe
(series 13, image 36).

Some associated susceptibility artifact on DWI but no superimposed
restricted diffusion to suggest acute infarction. No midline shift,
mass effect, evidence of mass lesion, ventriculomegaly, extra-axial
collection or acute intracranial hemorrhage. Cervicomedullary
junction and pituitary are within normal limits.

No discrete cortical encephalomalacia. Fairly symmetric appearance
of the bilateral hippocampal formations on thin slice T2 and FLAIR
imaging. Patchy and scattered bilateral white matter T2 and FLAIR
hyperintensity, including in subcortical white matter. Some areas
most resemble chronic white matter lacunar infarcts such as in the
anterior left corona radiata series 15, image 32. Asymmetric
involvement of the left external capsule. Deep gray matter nuclei
and brainstem are relatively spared. No abnormal enhancement
identified. No dural thickening.

Vascular: Major intracranial vascular flow voids are stable.
Dominant right vertebral artery. Mild generalized intracranial
artery tortuosity. Following contrast the major dural venous sinuses
are enhancing and appear to be patent.

Skull and upper cervical spine: Negative for age visible cervical
spine. Visualized bone marrow signal is within normal limits.

Sinuses/Orbits: Stable, negative.

Other: Mastoids remain well aerated. Visible internal auditory
structures appear normal. Negative visible scalp and face soft
tissues.
IMPRESSION: 1. Positive for bilateral Superficial Siderosis, more pronounced in
the left hemisphere. Fairly numerous superimposed chronic
microhemorrhages in the brain, although mostly in the posterior
circulation territory. Consider the possibility of Amyloid
Angiopathy.

2. No superimposed acute intracranial abnormality. Mild to moderate
for age scattered white matter signal changes most suggestive of
small vessel disease.

## 2022-05-18 DIAGNOSIS — E782 Mixed hyperlipidemia: Secondary | ICD-10-CM | POA: Diagnosis not present

## 2022-05-18 DIAGNOSIS — M79604 Pain in right leg: Secondary | ICD-10-CM | POA: Diagnosis not present

## 2022-05-18 DIAGNOSIS — I10 Essential (primary) hypertension: Secondary | ICD-10-CM | POA: Diagnosis not present

## 2022-05-18 DIAGNOSIS — M79605 Pain in left leg: Secondary | ICD-10-CM | POA: Diagnosis not present

## 2022-05-18 DIAGNOSIS — I2510 Atherosclerotic heart disease of native coronary artery without angina pectoris: Secondary | ICD-10-CM | POA: Diagnosis not present

## 2022-06-19 DIAGNOSIS — G8929 Other chronic pain: Secondary | ICD-10-CM | POA: Diagnosis not present

## 2022-07-03 ENCOUNTER — Other Ambulatory Visit: Payer: Self-pay | Admitting: *Deleted

## 2022-07-03 NOTE — Patient Outreach (Signed)
  Care Coordination   07/03/2022 Name: George Riley MRN: [id number] DOB: [birth year]   Care Coordination Outreach Attempts:  Contact was made with the patient today to offer care coordination services as a benefit of their health plan. The patient requested a return call on a later date.   Follow Up Plan:  Additional outreach attempts will be made to offer the patient care coordination information and services.   Encounter Outcome:  Pt. Request to Call Back  Care Coordination Interventions Activated:  No   Care Coordination Interventions:  No, not indicated    Kemper Durie, RN, MSN, [facility] Care Coordinator [phone]

## 2022-07-05 ENCOUNTER — Other Ambulatory Visit: Payer: Self-pay | Admitting: *Deleted

## 2022-07-05 NOTE — Patient Outreach (Signed)
  Care Coordination   07/05/2022 Name: George Riley MRN: [id number] DOB: [birth year]   Care Coordination Outreach Attempts:  A second unsuccessful outreach was attempted today to offer the patient with information about available care coordination services as a benefit of their health plan.     Follow Up Plan:  Additional outreach attempts will be made to offer the patient care coordination information and services.   Encounter Outcome:  No Answer  Care Coordination Interventions Activated:  No   Care Coordination Interventions:  No, not indicated    Kemper Durie, RN, MSN, [facility] Care Coordinator [phone]

## 2022-07-10 ENCOUNTER — Other Ambulatory Visit: Payer: Self-pay | Admitting: *Deleted

## 2022-07-10 ENCOUNTER — Encounter: Payer: Self-pay | Admitting: *Deleted

## 2022-07-10 DIAGNOSIS — I639 Cerebral infarction, unspecified: Secondary | ICD-10-CM

## 2022-07-10 NOTE — Patient Instructions (Signed)
Visit Information  Thank you for taking time to visit with me today. Please don't hesitate to contact me if I can be of assistance to you before our next scheduled telephone appointment.  Following are the goals we discussed today:  Monitor blood sugar 3 times a week, even if it is after patient has food.   Our next appointment is by telephone on 10/10  Patient verbalizes understanding of instructions and care plan provided today and agrees to view in MyChart. Active MyChart status and patient understanding of how to access instructions and care plan via MyChart confirmed with patient.     The patient has been provided with contact information for the care management team and has been advised to call with any health related questions or concerns.   Kemper Durie, RN, MSN, [facility] Care Coordinator [phone]

## 2022-07-10 NOTE — Patient Outreach (Signed)
  Care Coordination   Initial Visit Note   07/10/2022 Name: George Riley MRN: [id number] DOB: [birth year]  George Riley is a 76 y.o. year old male who sees Burdine, Ananias Pilgrim, MD for primary care. I spoke with wife of George Riley, George Riley by phone today  What matters to the patients health and wellness today?  Improvement of memory and management of diabetes.  Had stroke in the past and now starting to get memory back.  Per wife, member is much improved and progressing back to baseline.      Goals Addressed               This Visit's Progress     Management of DM (A1C curently 8.0) (pt-stated)        Care Coordination Interventions: Provided education to patient about basic DM disease process Discussed plans with patient for ongoing care management follow up and provided patient with direct contact information for care management team Reviewed scheduled/upcoming provider appointments including: Need for AWV Advised patient, providing education and rationale, to check cbg 3 times a week and record, calling PCP for findings outside established parameters Referral made to community resources care guide team for assistance with Transportation and utility support Assessed social determinant of health barriers         SDOH assessments and interventions completed:  Yes  SDOH Interventions Today    Flowsheet Row Most Recent Value  SDOH Interventions   Food Insecurity Interventions Intervention Not Indicated  Housing Interventions Intervention Not Indicated  Transportation Interventions Intervention Not Indicated        Care Coordination Interventions Activated:  Yes  Care Coordination Interventions:  Yes, provided   Follow up plan: Follow up call scheduled for 10/10    Encounter Outcome:  Pt. Visit Completed   George Durie, RN, MSN, [facility] Care Coordinator [phone]

## 2022-07-10 NOTE — Addendum Note (Signed)
Addended byKemper Durie on: 07/10/2022 02:29 PM   Modules accepted: Orders

## 2022-07-12 ENCOUNTER — Telehealth: Payer: Self-pay | Admitting: *Deleted

## 2022-07-12 NOTE — Telephone Encounter (Signed)
   Telephone encounter was:  Successful.  07/12/2022 Name: George Riley MRN: [id number] DOB: [birth year]  George Riley is a 76 y.o. year old male who is a primary care patient of Burdine, Ananias Pilgrim, MD . The community resource team was consulted for assistance with Transportation Needs  patient made aware they are a [facility] patient and would have transportation available if needed and provided concierge number and information    Care guide performed the following interventions: Patient provided with information about care guide support team and interviewed to confirm resource needs.  Follow Up Plan:  No further follow up planned at this time. The patient has been provided with needed resources.  Yehuda Mao Greenauer -[facility] [place], Population Health [phone] [address] [place] , [place] [address] Email : [email or web address]

## 2022-08-31 DIAGNOSIS — R1084 Generalized abdominal pain: Secondary | ICD-10-CM | POA: Diagnosis not present

## 2022-08-31 DIAGNOSIS — N509 Disorder of male genital organs, unspecified: Secondary | ICD-10-CM | POA: Diagnosis not present

## 2022-08-31 DIAGNOSIS — R109 Unspecified abdominal pain: Secondary | ICD-10-CM | POA: Diagnosis not present

## 2022-09-05 ENCOUNTER — Ambulatory Visit: Payer: Self-pay | Admitting: *Deleted

## 2022-09-05 NOTE — Patient Instructions (Signed)
Visit Information  Thank you for taking time to visit with me today. Please don't hesitate to contact me if I can be of assistance to you before our next scheduled telephone appointment.  Following are the goals we discussed today:  Check blood sugars at least daily. Attend neurology appointment on 10/17 @ 315pm.  Our next appointment is by telephone on 10/18  Please call the care guide team at [phone] if you need to cancel or reschedule your appointment.   Please call the Suicide and Crisis Lifeline: 988 call the [place] National Suicide Prevention Lifeline: (435)261-5688 or TTY: 601-874-0702 TTY (201)251-4677) to talk to a trained counselor call 1-800-273-TALK (toll free, 24 hour hotline) call the [facility]: [phone] call 911 if you are experiencing a Mental Health or [place] or need someone to talk to.  Patient verbalizes understanding of instructions and care plan provided today and agrees to view in [place]. Active MyChart status and patient understanding of how to access instructions and care plan via MyChart confirmed with patient.     The patient has been provided with contact information for the care management team and has been advised to call with any health related questions or concerns.   Valente David, RN, MSN, [place] Care Management Care Management Coordinator [phone]

## 2022-09-05 NOTE — Patient Outreach (Signed)
  Care Coordination   Follow Up Visit Note   09/05/2022 Name: George Riley MRN: [id number] DOB: [birth year]  George Riley is a 76 y.o. year old male who sees Burdine, Virgina Evener, MD for primary care. I spoke with Collie Siad, wife of George Riley by phone today.  What matters to the patients health and wellness today?  Per wife, member has had increased number in days where he is feeling more confused and dizzy, today being one of those days.  She has not checked blood sugar today, encouraged to do so to make sure he is not experiencing hypo-hyperglycemic event.  Blood sugar was within range, 157.  Neurology appointment scheduled for 12/5, she would like a sooner visit.      Goals Addressed               This Visit's Progress     Management of DM (A1C curently 8.0) (pt-stated)   On track     Care Coordination Interventions: Provided education to patient about basic DM disease process Discussed plans with patient for ongoing care management follow up and provided patient with direct contact information for care management team Reviewed scheduled/upcoming provider appointments including: Need for AWV Advised patient, providing education and rationale, to check cbg 3 times a week and record, calling PCP for findings outside established parameters Call placed to neurology office to schedule appointment to evaluate confusion (scheduled for 10/17 @ 315)         SDOH assessments and interventions completed:  No     Care Coordination Interventions Activated:  Yes  Care Coordination Interventions:  Yes, provided   Follow up plan: Follow up call scheduled for 10/18    Encounter Outcome:  Pt. Visit Completed   Valente David, RN, MSN, [place] Care Management Care Management Coordinator [phone]

## 2022-09-12 DIAGNOSIS — Z8673 Personal history of transient ischemic attack (TIA), and cerebral infarction without residual deficits: Secondary | ICD-10-CM | POA: Diagnosis not present

## 2022-09-12 DIAGNOSIS — F0390 Unspecified dementia without behavioral disturbance: Secondary | ICD-10-CM | POA: Diagnosis not present

## 2022-09-13 ENCOUNTER — Ambulatory Visit: Payer: Self-pay | Admitting: *Deleted

## 2022-09-13 ENCOUNTER — Encounter: Payer: Self-pay | Admitting: *Deleted

## 2022-09-13 NOTE — Patient Outreach (Signed)
  Care Coordination   Follow Up Visit Note   09/13/2022 Name: George Riley MRN: [id number] DOB: 04-07-1946  George Riley is a 76 y.o. year old male who sees Burdine, Virgina Evener, MD for primary care. I  spoke with wife, George Riley, by telephone today.   What matters to the patients health and wellness today?  Managing blood sugar and following up with neurologist re: memory changes    Goals Addressed               This Visit's Progress     Patient Stated     Management of DM (A1C curently 8.0) (pt-stated)        Care Coordination Interventions: Discussed plans with patient for ongoing care management follow up and provided patient with direct contact information for care management team Reviewed scheduled/upcoming provider appointments including: Dr Rip Harbour (neurologist in [place], [place]) in 11/2022, per Wife Advised patient, providing education and rationale, to check cbg 3 times a week and record, calling PCP for findings outside established parameters Reviewed and discussed medications. Updated medication list by adding Diabeta by reconciling with pharmacy records.  Reviewed appointment cancellations with wife. He cancelled neuro apt with Dr Leonie Man and is now seeing Dr Volanda Napoleon. Added Dr Volanda Napoleon to care team. Attempted to view records in [place] for visit yesterday, but even though it shows that the [facility] EMR is linked, the records did not update. Collaborated with teammates regarding this. No solution at this time.  Scheduled telephone f/u with George David, RN Care Coordinator [phone]) for 10/12/22 at 11:30 Encouraged to reach out to care coordination team as needed Encouraged to f/u with PCP and neurologist as scheduled Advised to call PCP or seek emergency medical attention if needed        SDOH assessments and interventions completed:  Yes  SDOH Interventions Today    Flowsheet Row Most Recent Value  SDOH Interventions   Housing Interventions Intervention Not  Indicated  Transportation Interventions Intervention Not Indicated  Financial Strain Interventions Intervention Not Indicated        Care Coordination Interventions Activated:  Yes  Care Coordination Interventions:  Yes, provided   Follow up plan: Follow up call scheduled for 10/12/22 at 11:30 with George David, RN Care Coordinator [phone])  Encounter Outcome:  Pt. Visit Completed   Chong Sicilian, BSN, RN-BC RN Care Coordinator [place]: [phone] Main #: [phone]

## 2022-10-12 ENCOUNTER — Ambulatory Visit: Payer: Self-pay | Admitting: *Deleted

## 2022-10-13 ENCOUNTER — Encounter: Payer: Self-pay | Admitting: *Deleted

## 2022-10-13 NOTE — Patient Outreach (Signed)
  Care Coordination   Follow Up Visit Note   10/12/2022 Name: George Riley MRN: [id number] DOB: 11-22-1946  George Riley is a 76 y.o. year old male who sees Burdine, George Pilgrim, MD for primary care. I  spoke with wife by telephone today. Patient was outside an prefers wife to talk anyway.  What matters to the patients health and wellness today?  Caregiver strain (wife), memory and personality changes, and blood sugar management    Goals Addressed               This Visit's Progress     Patient Stated     Management of DM (A1C curently 8.0) (pt-stated)        Care Coordination Interventions: Reviewed medications with patient and discussed importance of medication adherence Counseled on importance of regular laboratory monitoring as prescribed Discussed plans with patient for ongoing care management follow up and provided patient with direct contact information for care management team Advised patient, providing education and rationale, to check cbg 3 times a week and record, calling PCP for findings outside established parameters Review of patient status, including review of consultants reports, relevant laboratory and other test results, and medications completed Assessed social determinant of health barriers Encouraged to reach out to care coordination team as needed Advised to call PCP or seek emergency medical attention if needed      Other     Personality Changes, Memory Loss, & Caregiver Strain        Care Coordination Interventions: Evaluation of current treatment plan related to memory loss and personality changes and patient's adherence to plan as established by provider Advised patient to talk with neurologist about any new or worsening symptoms Reviewed medications with patient and discussed compliance, access, and affordability Social Work referral for caregiver strain (wife would like to wait until 11/2022) Assessed social determinant of health barriers Encouraged  to reach out to [facility] as needed         SDOH assessments and interventions completed:  Yes  SDOH Interventions Today    Flowsheet Row Most Recent Value  SDOH Interventions   Housing Interventions Intervention Not Indicated  Transportation Interventions Intervention Not Indicated  Financial Strain Interventions Intervention Not Indicated        Care Coordination Interventions Activated:  Yes  Care Coordination Interventions:  Yes, provided   Follow up plan: Referral made to [facility], LCSW (call in Jan 2024) Follow up call scheduled for 12/12/22 with [name], LCSW   RNCC to follow-up after LCSW call if needed  Encounter Outcome:  Pt. Visit Completed   Demetrios Loll, BSN, RN-BC RN Care Coordinator [facility]  [facility] Direct Dial: [phone] Main #: [phone]

## 2022-12-07 DIAGNOSIS — N401 Enlarged prostate with lower urinary tract symptoms: Secondary | ICD-10-CM | POA: Diagnosis not present

## 2022-12-11 ENCOUNTER — Ambulatory Visit: Payer: Self-pay | Admitting: *Deleted

## 2022-12-12 ENCOUNTER — Encounter: Payer: Self-pay | Admitting: *Deleted

## 2022-12-12 NOTE — Patient Outreach (Signed)
Care Coordination   Initial Visit Note   12/12/2022  Name: BOYKIN BAETZ MRN: [id number] DOB: 10-02-1946  RAYVON DAKIN is a 77 y.o. year old male who sees Burdine, Virgina Evener, MD for primary care. I spoke with patient's wife, Timoteo Carreiro by phone today.  What matters to the patients health and wellness today?  Receive Assistance Obtaining Caregiver Support and Resources.    Goals Addressed             This Visit's Progress    Receive Assistance Obtaining Caregiver Support and Resources.   On track    Care Coordination Interventions:  Assessed Social Determinant of Health Barriers. Discussed Plans for Ongoing Care Management Follow Up. Provided Tree surgeon Information for Care Management Team. Screened for Signs & Symptoms of Depression Related to Chronic Disease State, in Both Patient & Wife.  PHQ2 & PHQ9 Depression Screen Completed & Results Reviewed.  Suicidal Ideation & Homicidal Ideation Assessed - None Present.      Active Listening & Reflection Utilized.  Verbalization of Feelings Encouraged.  Emotional & Caregiver Support Provided. Caregiver Stress Acknowledged. Caregiver Resources Reviewed. Caregiver Support Groups Offered & Self-Enrollment Encouraged. Provided Crisis Support Information, Agencies & Resources. Problem Solving Interventions Identified. Task-Centered Solutions Implemented.  Solution-Focused Strategies Developed.  Psychoeducation for Mental Health Concerns Initiated. Reviewed Prescription Medications & Discussed Importance of Compliance. Quality of Sleep Assessed & Sleep Hygiene Techniques Promoted. Discussed Higher Level of Care Options ([place], [place]) with Patient's Wife & Encouraged Consideration. Verified No In-Home Care Services, [employer], Building control surveyor, Etc., Covered Under Teaching laboratory technician.  Reviewed Designer, television/film set with  Patient's Wife, through [facility] & Ensured Understanding through Teach Back. Verified Patient/Spouse Combined Monthly Social Security Income Exceeds Poverty Guidelines for the [place] of [place] for [employer] 2024, Making Patient Ineligible for Medicaid. Verified No Long-Term Care Insurance Benefits, [employer], Plans, Etc.  Confirmed Patient, Nor Patient's Wife, Were Veterans, Making Patient Ineligible for Aid & Attendance Benefits, Through [employer]. Please Review the Following List of Agencies, [employer], Mailed on 12/12/2022:   ~ [place]   ~ [place]   ~ [place] Providers   ~ Adult Day Care Programs   ~ Senior Resources of [employer]   ~ [place] Please Research scientist (medical), [place], From the List Provided.            SDOH assessments and interventions completed:  Yes.  SDOH Interventions Today    Flowsheet Row Most Recent Value  SDOH Interventions   Food Insecurity Interventions Intervention Not Indicated  [Verified by Wife, [place] Interventions Intervention Not Indicated  [Verified by Wife, Collie Siad [place]]  Transportation Interventions Intervention Not Indicated, Patient Resources (Friends/Family), Payor Benefit  [Verified by Wife, Collie Siad [place]]  Utilities Interventions Intervention Not Indicated  [Verified by Wife, Collie Siad [place]]  Alcohol Usage Interventions Intervention Not Indicated (Score <7)  [Verified by Wife, Collie Siad [place]]  Financial Strain Interventions Intervention Not Indicated  [Verified by Wife, Collie Siad Bartus]  Physical Activity Interventions Patient Refused  [Verified by Wife, Collie Siad [place]es]  Stress Interventions Intervention Not Indicated  [Verified by Wife, Collie Siad Deiter]  Social Connections Interventions Intervention Not Indicated  [Verified by Wife, Collie Siad [place]]     Care  Coordination Interventions:  Yes, provided.   Follow up plan:  Follow up call scheduled for 12/22/2022 at 2:15 pm.  Encounter Outcome:  Pt. Visit Completed.   Nat Christen, BSW, MSW, LCSW  Licensed Education officer, environmental Health System  Mailing [place] [address], [place], [place] [address] Physical Address[address], [place], [place] [address] Toll Free Main # [phone] Fax # [phone] Cell # [phone] [email or web address]

## 2022-12-12 NOTE — Patient Instructions (Signed)
Visit Information  Thank you for taking time to visit with me today. Please don't hesitate to contact me if I can be of assistance to you.   Following are the goals we discussed today:   Goals Addressed             This Visit's Progress    Receive Assistance Obtaining Caregiver Support and Resources.   On track    Care Coordination Interventions:  Assessed Social Determinant of Health Barriers. Discussed Plans for Ongoing Care Management Follow Up. Provided Tree surgeon Information for Care Management Team. Screened for Signs & Symptoms of Depression Related to Chronic Disease State, in Both Patient & Wife.  PHQ2 & PHQ9 Depression Screen Completed & Results Reviewed.  Suicidal Ideation & Homicidal Ideation Assessed - None Present.      Active Listening & Reflection Utilized.  Verbalization of Feelings Encouraged.  Emotional & Caregiver Support Provided. Caregiver Stress Acknowledged. Caregiver Resources Reviewed. Caregiver Support Groups Offered & Self-Enrollment Encouraged. Provided Crisis Support Information, Agencies & Resources. Problem Solving Interventions Identified. Task-Centered Solutions Implemented.  Solution-Focused Strategies Developed.  Psychoeducation for Mental Health Concerns Initiated. Reviewed Prescription Medications & Discussed Importance of Compliance. Quality of Sleep Assessed & Sleep Hygiene Techniques Promoted. Discussed Higher Level of Care Options ([place], [place]) with Patient's Wife & Encouraged Consideration. Verified No In-Home Care Services, [employer], Building control surveyor, Etc., Covered Under Teaching laboratory technician.  Reviewed Designer, television/film set with Patient's Wife, through [facility] & Ensured Understanding through Teach Back. Verified Patient/Spouse Combined Monthly Social Security Income Exceeds Poverty Guidelines for the [place] of  [place] for [employer] 2024, Making Patient Ineligible for Medicaid. Verified No Long-Term Care Insurance Benefits, [employer], Plans, Etc.  Confirmed Patient, Nor Patient's Wife, Were Veterans, Making Patient Ineligible for Aid & Attendance Benefits, Through [employer]. Please Review the Following List of Agencies, [employer], Mailed on 12/12/2022:   ~ [place]   ~ [place]   ~ [place] Providers   ~ Adult Day Care Programs   ~ Senior Resources of [employer]   ~ [place] Please Research scientist (medical), [place], From the List Provided.          Our next appointment is by telephone on 12/22/2022 at 2:15 pm.  Please call the care guide team at [phone] if you need to cancel or reschedule your appointment.   If you are experiencing a Mental Health or [place] or need someone to talk to, please call the Suicide and Crisis Lifeline: 988 call the [place] National Suicide Prevention Lifeline: (719)232-4832 or TTY: 343-696-9060 TTY (916)605-4157) to talk to a trained counselor call 1-800-273-TALK (toll free, 24 hour hotline) go to [facility] [address] [place] [phone]) call the [place]: [phone] call 911  Patient verbalizes understanding of instructions and care plan provided today and agrees to view in [place]. Active MyChart status and patient understanding of how to access instructions and care plan via MyChart confirmed with patient.     Telephone follow up appointment with care management team member scheduled for:  12/22/2022 at 2:15 pm.    Nat Christen, BSW, MSW, [place]  Licensed Clinical Social Worker  [place]  Mailing [place]. [address],  [place], [place] [address]  Physical Address[address], [place], [place] [address] Toll Free Main # [phone] Fax # [phone] Cell # [phone] [email or web address]

## 2022-12-13 DIAGNOSIS — R351 Nocturia: Secondary | ICD-10-CM | POA: Diagnosis not present

## 2022-12-13 DIAGNOSIS — R350 Frequency of micturition: Secondary | ICD-10-CM | POA: Diagnosis not present

## 2022-12-13 DIAGNOSIS — N401 Enlarged prostate with lower urinary tract symptoms: Secondary | ICD-10-CM | POA: Diagnosis not present

## 2022-12-13 DIAGNOSIS — R3915 Urgency of urination: Secondary | ICD-10-CM | POA: Diagnosis not present

## 2022-12-14 DIAGNOSIS — F0390 Unspecified dementia without behavioral disturbance: Secondary | ICD-10-CM | POA: Diagnosis not present

## 2022-12-14 DIAGNOSIS — Z8679 Personal history of other diseases of the circulatory system: Secondary | ICD-10-CM | POA: Diagnosis not present

## 2022-12-14 DIAGNOSIS — I6930 Unspecified sequelae of cerebral infarction: Secondary | ICD-10-CM | POA: Diagnosis not present

## 2022-12-22 ENCOUNTER — Encounter: Payer: Self-pay | Admitting: *Deleted

## 2022-12-22 ENCOUNTER — Ambulatory Visit: Payer: Self-pay | Admitting: *Deleted

## 2022-12-22 NOTE — Patient Instructions (Signed)
Visit Information  Thank you for taking time to visit with me today. Please don't hesitate to contact me if I can be of assistance to you.   Following are the goals we discussed today:   Goals Addressed             This Visit's Progress    Receive Assistance Obtaining Caregiver Support and Resources.   On track    Care Coordination Interventions:  Active Listening & Reflection Utilized.  Verbalization of Feelings Encouraged.  Emotional & Caregiver Support Provided. Caregiver Stress Acknowledged. Caregiver Resources Reviewed. Caregiver Support Groups Offered & Self-Enrollment Encouraged. Reviewed Crisis Support Information, Agencies & Resources. Problem Solving Interventions Enacted. Task-Centered Solutions Implemented.  Solution-Focused Strategies Employed. Acceptance & Commitment Therapy Introduced. Brief Client-Centered Therapy Performed.  Discussed Higher Level of Care Options ([place], [place]) with Patient's Wife & Encouraged Consideration. CSW Collaboration with Wife, Sutton Hirsch to [employer] Review the Following List of Agencies, [employer], to [employer] Understanding:   ~ [place]   ~ [place]   ~ Occupational psychologist Providers   ~ [place]   ~ [employer] [employer] of [employer]   ~ [place] with Wife, Ryett Hamman to [employer] with Agencies, Services & Resources of Interest, in An Effort to [employer] in [place] for Patient.          Our next appointment is by telephone on 01/12/2023 at 9:00 am.  Please call the care guide team at [phone] if you need to cancel or reschedule your appointment.   If you are experiencing a Mental Health or [place] or need someone to talk to, please call the Suicide and Crisis Lifeline: 988 call  the [place] National Suicide Prevention Lifeline: 7476132943 or TTY: 579-493-1494 TTY (857)301-7412) to talk to a trained counselor call 1-800-273-TALK (toll free, 24 hour hotline) go to [facility] [address] [place] [phone]) call the [place]: [phone] call 911  Patient verbalizes understanding of instructions and care plan provided today and agrees to view in [place]. Active MyChart status and patient understanding of how to access instructions and care plan via MyChart confirmed with patient.     Telephone follow up appointment with care management team member scheduled for:  01/12/2023 at 9:00 am.  Nat Christen, BSW, MSW, [place]  Licensed Clinical Social Worker  [place]  Mailing [place]. [address], [place], [place] [address] Physical Address[address], [place], [place] [address] Toll Free Main # [phone] Fax # [phone] Cell # [phone] [email or web address]

## 2022-12-22 NOTE — Patient Outreach (Signed)
  Care Coordination   Follow Up Visit Note   12/22/2022  Name: George Riley MRN: [id number] DOB: 1946/10/02  George Riley is a 77 y.o. year old male who sees Burdine, Virgina Evener, MD for primary care. I spoke with wife, George Riley by phone today.  What matters to the patients health and wellness today?  Receive Assistance Obtaining Caregiver Support and Resources.   Goals Addressed             This Visit's Progress    Receive Assistance Obtaining Caregiver Support and Resources.   On track    Care Coordination Interventions:  Active Listening & Reflection Utilized.  Verbalization of Feelings Encouraged.  Emotional & Caregiver Support Provided. Caregiver Stress Acknowledged. Caregiver Resources Reviewed. Caregiver Support Groups Offered & Self-Enrollment Encouraged. Reviewed Crisis Support Information, Agencies & Resources. Problem Solving Interventions Enacted. Task-Centered Solutions Implemented.  Solution-Focused Strategies Employed. Acceptance & Commitment Therapy Introduced. Brief Client-Centered Therapy Performed.  Discussed Higher Level of Care Options ([place], [place]) with Patient's Wife & Encouraged Consideration. CSW Collaboration with Wife, George Riley to [employer] Review the Following List of Agencies, [employer], to [employer] Understanding:   ~ [place]   ~ [place]   ~ Occupational psychologist Providers   ~ [place]   ~ [employer] of [employer]   ~ [place] with Wife, George Riley to [employer] with Agencies, Services & Resources of Interest, in An Effort to [employer] in [place] for Patient.          SDOH assessments and interventions completed:  Yes.  Care Coordination Interventions:  Yes, provided.   Follow up plan: Follow up call  scheduled for 01/12/2023 at 9:00 am.  Encounter Outcome:  Pt. Visit Completed.   Nat Christen, BSW, MSW, LCSW  Licensed Education officer, environmental Health System  Mailing [place] [address], [place], [place] [address] Physical Address[address], [place], [place] [address] Toll Free Main # [phone] Fax # [phone] Cell # [phone] [email or web address]

## 2023-01-12 ENCOUNTER — Encounter: Payer: Self-pay | Admitting: *Deleted

## 2023-01-12 ENCOUNTER — Ambulatory Visit: Payer: Self-pay | Admitting: *Deleted

## 2023-01-12 NOTE — Patient Instructions (Signed)
Visit Information  Thank you for taking time to visit with me today. Please don't hesitate to contact me if I can be of assistance to you.   Following are the goals we discussed today:   Goals Addressed             This Visit's Progress    COMPLETED: Receive Assistance Obtaining Caregiver Support and Resources.   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]  General Interventions   General Interventions Discussed/Reviewed General Interventions Discussed, General Interventions Reviewed, Annual Eye Exam, Durable Medical Equipment (DME), Vaccines, Health Screening, [employer], Doctor Visits, Communication with, Level of Care  [Primary Care Provider]  Vaccines COVID-19, Flu, Pneumonia, RSV, Shingles  [Encouraged]  Doctor Visits Discussed/Reviewed Doctor Visits Discussed, Doctor Visits Reviewed, Annual Wellness Visits, PCP  [Encouraged]  Health Screening Colonoscopy, Prostate  [Encouraged]  Durable Medical Equipment (DME) BP Cuff, Walker  PCP/Specialist Visits Compliance with follow-up visit  Communication with PCP/Specialists, RN  Level of Care Adult Daycare, Applications, Assisted Living, Personal Care Services  Applications Medicaid, Personal Care Services, [place]  Exercise Interventions   Exercise Discussed/Reviewed Exercise Discussed, Exercise Reviewed, Physical Activity, Assistive device use and maintanence  Physical Activity Discussed/Reviewed Physical Activity Discussed, Physical Activity Reviewed, Types of exercise  Education Interventions   Education Provided Provided Printed Education, Provided Education  Provided Verbal Education On Nutrition, Eye Care, Mental Health/Coping with Illness, Applications, Exercise, Medication, When to see the doctor, [employer], Engineer, materials, Personal Care  Services, [place]  Reviewed, Coping Strategies, Crisis, Anxiety, Depression  Nutrition Interventions   Nutrition Discussed/Reviewed Nutrition Discussed, Nutrition Reviewed, Fluid intake, Increaing proteins, Decreasing fats, Decreasing sugar intake, Decreasing salt  Pharmacy Interventions   Pharmacy Dicussed/Reviewed Pharmacy Topics Discussed, Pharmacy Topics Reviewed, Medication Adherence, Affording Medications  Safety Interventions   Safety Discussed/Reviewed Safety Discussed, Safety Reviewed, Fall Risk  Advanced Directive Interventions   Advanced Directives Discussed/Reviewed Advanced Directives Discussed, Advanced Directives Reviewed     Active Listening & Reflection Utilized.  Verbalization of Feelings Encouraged.  Emotional Support Provided. Feelings of Caregiver Burnout Validated. Caregiver Stress Acknowledged. Caregiver Resources Reviewed. Caregiver Support Groups Offered. Self-Enrollment in Caregiver Support Group of Interest Emphasized. Reviewed Crisis Support Information, Agencies, Services & Resources. Problem Solving Interventions Enacted. Task-Centered Solutions Implemented.  Solution-Focused Strategies Employed. Acceptance & Commitment Therapy Initiated. Client-Centered Therapy Performed.  Cognitive Behavioral Therapy Indicated. CSW Collaboration with Wife, George Riley to [employer] Review the Following List of Agencies, [place] with Agencies of Interest, in An Effort to [place]:   ~ [place]   ~ [place]   ~ Occupational psychologist Providers   ~ [place]   ~ [employer]   ~ Aging, [place] with Wife, George Riley to [employer] with [place] [phone]# [phone]), if She  Has Questions, Needs Assistance, or If Additional Social Work Needs Are Identified.          Please call the care guide team at [phone] if you need to cancel or reschedule your appointment.   If you are experiencing a Mental Health or [place] or need someone to talk to, please  call the Suicide and Crisis Lifeline: 988 call the [place] National Suicide Prevention Lifeline: 808 431 3328 or TTY: (757) 128-3894 TTY 705-359-2971) to talk to a trained counselor call 1-800-273-TALK (toll free, 24 hour hotline) go to [facility] [address] [place] [phone]) call the [place]: [phone] call 911  Patient verbalizes understanding of instructions and care plan provided today and agrees to view in [place]. Active MyChart status and patient understanding of how to access instructions and care plan via MyChart confirmed with patient.     No further follow-up required.  George Riley, BSW, MSW, LCSW  Licensed Education officer, [facility]  Mailing [place] [address] [place], [place] [address] Physical Address-[address], [place], [place] [address] Toll Free Main # [phone] Fax # [phone] Cell # [phone] [email or web address]

## 2023-01-12 NOTE — Patient Outreach (Signed)
Care Coordination   Follow Up Visit Note   01/12/2023  Name: KYI SHOUP MRN: [id number] DOB: 1946/07/11  George Riley is a 77 y.o. year old male who sees Burdine, Virgina Evener, MD for primary care. I spoke with patient's wife, George Riley by phone today.  What matters to the patients health and wellness today?   Receive Assistance Obtaining Caregiver Support and Resources.   Goals Addressed             This Visit's Progress    COMPLETED: Receive Assistance Obtaining Caregiver Support and Resources.   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]  General Interventions   General Interventions Discussed/Reviewed General Interventions Discussed, General Interventions Reviewed, Annual Eye Exam, Durable Medical Equipment (DME), Vaccines, Health Screening, [employer], Doctor Visits, Communication with, Level of Care  [Primary Care Provider]  Vaccines COVID-19, Flu, Pneumonia, RSV, Shingles  [Encouraged]  Doctor Visits Discussed/Reviewed Doctor Visits Discussed, Doctor Visits Reviewed, Annual Wellness Visits, PCP  [Encouraged]  Health Screening Colonoscopy, Prostate  [Encouraged]  Durable Medical Equipment (DME) BP Cuff, Walker  PCP/Specialist Visits Compliance with follow-up visit  Communication with PCP/Specialists, RN  Level of Care Adult Daycare, Applications, Assisted Living, Personal Care Services  Applications Medicaid, Personal Care Services, [place]  Exercise Interventions   Exercise Discussed/Reviewed Exercise Discussed, Exercise Reviewed, Physical Activity, Assistive device use and maintanence  Physical Activity Discussed/Reviewed Physical Activity Discussed, Physical Activity Reviewed, Types of exercise  Education Interventions   Education Provided Provided Printed Education, Provided Education  Provided Verbal Education  On Nutrition, Eye Care, Mental Health/Coping with Illness, Applications, Exercise, Medication, When to see the doctor, [employer], Engineer, materials, Personal Care Services, [place] Reviewed, Coping Strategies, Crisis, Anxiety, Depression  Nutrition Interventions   Nutrition Discussed/Reviewed Nutrition Discussed, Nutrition Reviewed, Fluid intake, Increaing proteins, Decreasing fats, Decreasing sugar intake, Decreasing salt  Pharmacy Interventions   Pharmacy Dicussed/Reviewed Pharmacy Topics Discussed, Pharmacy Topics Reviewed, Medication Adherence, Affording Medications  Safety Interventions   Safety Discussed/Reviewed Safety Discussed, Safety Reviewed, Fall Risk  Advanced Directive Interventions   Advanced Directives Discussed/Reviewed Advanced Directives Discussed, Advanced Directives Reviewed     Active Listening & Reflection Utilized.  Verbalization of Feelings Encouraged.  Emotional Support Provided. Feelings of Caregiver Burnout Validated. Caregiver Stress Acknowledged. Caregiver Resources Reviewed. Caregiver Support Groups Offered. Self-Enrollment in Caregiver Support Group of Interest Emphasized. Reviewed Crisis Support Information, Agencies, Services & Resources. Problem Solving Interventions Enacted. Task-Centered Solutions Implemented.  Solution-Focused Strategies Employed. Acceptance & Commitment Therapy Initiated. Client-Centered Therapy Performed.  Cognitive Behavioral Therapy Indicated. CSW Collaboration with Wife, George Riley to [employer] Review the Following List of Agencies, [place] with Agencies of Interest, in An Effort to [place]:   ~ [place]   ~ [place]   ~ Occupational psychologist Providers   ~ [place]   ~  [employer]   ~ Aging, [place] with Wife, Zathan Berth to [employer] with [place] [phone]# [phone]), if She Has Questions, Needs Assistance, or If Additional Social Work Needs Are Identified.          SDOH assessments and interventions  completed:  Yes.  Care Coordination Interventions:  Yes, provided.   Follow up plan: No further intervention required.   Encounter Outcome:  Pt. Visit Completed.   Nat Christen, BSW, MSW, LCSW  Licensed Education officer, environmental Health System  Mailing [place] [address] [place], [place] [address] Physical Address[address], [place], [place] [address] Toll Free Main # [phone] Fax # [phone] Cell # [phone] [email or web address]

## 2023-04-09 DIAGNOSIS — Z1329 Encounter for screening for other suspected endocrine disorder: Secondary | ICD-10-CM | POA: Diagnosis not present

## 2023-04-09 DIAGNOSIS — K219 Gastro-esophageal reflux disease without esophagitis: Secondary | ICD-10-CM | POA: Diagnosis not present

## 2023-04-09 DIAGNOSIS — R739 Hyperglycemia, unspecified: Secondary | ICD-10-CM | POA: Diagnosis not present

## 2023-04-09 DIAGNOSIS — E7849 Other hyperlipidemia: Secondary | ICD-10-CM | POA: Diagnosis not present

## 2023-04-09 DIAGNOSIS — I10 Essential (primary) hypertension: Secondary | ICD-10-CM | POA: Diagnosis not present

## 2023-04-12 DIAGNOSIS — I2510 Atherosclerotic heart disease of native coronary artery without angina pectoris: Secondary | ICD-10-CM | POA: Diagnosis not present

## 2023-04-12 DIAGNOSIS — Z951 Presence of aortocoronary bypass graft: Secondary | ICD-10-CM | POA: Diagnosis not present

## 2023-04-12 DIAGNOSIS — F339 Major depressive disorder, recurrent, unspecified: Secondary | ICD-10-CM | POA: Diagnosis not present

## 2023-04-12 DIAGNOSIS — E871 Hypo-osmolality and hyponatremia: Secondary | ICD-10-CM | POA: Diagnosis not present

## 2023-04-12 DIAGNOSIS — F0390 Unspecified dementia without behavioral disturbance: Secondary | ICD-10-CM | POA: Diagnosis not present

## 2023-04-12 DIAGNOSIS — Z955 Presence of coronary angioplasty implant and graft: Secondary | ICD-10-CM | POA: Diagnosis not present

## 2023-04-12 DIAGNOSIS — B079 Viral wart, unspecified: Secondary | ICD-10-CM | POA: Diagnosis not present

## 2023-04-12 DIAGNOSIS — I10 Essential (primary) hypertension: Secondary | ICD-10-CM | POA: Diagnosis not present

## 2023-04-12 DIAGNOSIS — E1159 Type 2 diabetes mellitus with other circulatory complications: Secondary | ICD-10-CM | POA: Diagnosis not present

## 2023-05-09 DIAGNOSIS — Z1283 Encounter for screening for malignant neoplasm of skin: Secondary | ICD-10-CM | POA: Diagnosis not present

## 2023-05-09 DIAGNOSIS — L821 Other seborrheic keratosis: Secondary | ICD-10-CM | POA: Diagnosis not present

## 2023-05-09 DIAGNOSIS — D485 Neoplasm of uncertain behavior of skin: Secondary | ICD-10-CM | POA: Diagnosis not present

## 2023-05-09 DIAGNOSIS — L814 Other melanin hyperpigmentation: Secondary | ICD-10-CM | POA: Diagnosis not present

## 2023-05-09 DIAGNOSIS — D2339 Other benign neoplasm of skin of other parts of face: Secondary | ICD-10-CM | POA: Diagnosis not present

## 2023-05-17 DIAGNOSIS — I2510 Atherosclerotic heart disease of native coronary artery without angina pectoris: Secondary | ICD-10-CM | POA: Diagnosis not present

## 2023-05-17 DIAGNOSIS — M79605 Pain in left leg: Secondary | ICD-10-CM | POA: Diagnosis not present

## 2023-05-17 DIAGNOSIS — M79604 Pain in right leg: Secondary | ICD-10-CM | POA: Diagnosis not present

## 2023-05-17 DIAGNOSIS — I10 Essential (primary) hypertension: Secondary | ICD-10-CM | POA: Diagnosis not present

## 2023-05-17 DIAGNOSIS — E782 Mixed hyperlipidemia: Secondary | ICD-10-CM | POA: Diagnosis not present

## 2023-07-04 DIAGNOSIS — R739 Hyperglycemia, unspecified: Secondary | ICD-10-CM | POA: Diagnosis not present

## 2023-07-04 DIAGNOSIS — E875 Hyperkalemia: Secondary | ICD-10-CM | POA: Diagnosis not present

## 2023-07-04 DIAGNOSIS — E1159 Type 2 diabetes mellitus with other circulatory complications: Secondary | ICD-10-CM | POA: Diagnosis not present

## 2023-07-09 DIAGNOSIS — F0390 Unspecified dementia without behavioral disturbance: Secondary | ICD-10-CM | POA: Diagnosis not present

## 2023-07-09 DIAGNOSIS — E871 Hypo-osmolality and hyponatremia: Secondary | ICD-10-CM | POA: Diagnosis not present

## 2023-07-09 DIAGNOSIS — Z951 Presence of aortocoronary bypass graft: Secondary | ICD-10-CM | POA: Diagnosis not present

## 2023-07-09 DIAGNOSIS — Z955 Presence of coronary angioplasty implant and graft: Secondary | ICD-10-CM | POA: Diagnosis not present

## 2023-07-09 DIAGNOSIS — I2510 Atherosclerotic heart disease of native coronary artery without angina pectoris: Secondary | ICD-10-CM | POA: Diagnosis not present

## 2023-07-09 DIAGNOSIS — E875 Hyperkalemia: Secondary | ICD-10-CM | POA: Diagnosis not present

## 2023-07-09 DIAGNOSIS — E1159 Type 2 diabetes mellitus with other circulatory complications: Secondary | ICD-10-CM | POA: Diagnosis not present

## 2023-07-09 DIAGNOSIS — I10 Essential (primary) hypertension: Secondary | ICD-10-CM | POA: Diagnosis not present

## 2023-07-09 DIAGNOSIS — F339 Major depressive disorder, recurrent, unspecified: Secondary | ICD-10-CM | POA: Diagnosis not present

## 2023-07-23 DIAGNOSIS — G4700 Insomnia, unspecified: Secondary | ICD-10-CM | POA: Diagnosis not present

## 2023-07-23 DIAGNOSIS — F0390 Unspecified dementia without behavioral disturbance: Secondary | ICD-10-CM | POA: Diagnosis not present

## 2023-08-02 DIAGNOSIS — H43811 Vitreous degeneration, right eye: Secondary | ICD-10-CM | POA: Diagnosis not present

## 2023-08-02 DIAGNOSIS — H524 Presbyopia: Secondary | ICD-10-CM | POA: Diagnosis not present

## 2023-08-02 DIAGNOSIS — H25813 Combined forms of age-related cataract, bilateral: Secondary | ICD-10-CM | POA: Diagnosis not present

## 2023-08-02 DIAGNOSIS — H52223 Regular astigmatism, bilateral: Secondary | ICD-10-CM | POA: Diagnosis not present

## 2023-08-02 DIAGNOSIS — H5203 Hypermetropia, bilateral: Secondary | ICD-10-CM | POA: Diagnosis not present

## 2023-08-02 DIAGNOSIS — E119 Type 2 diabetes mellitus without complications: Secondary | ICD-10-CM | POA: Diagnosis not present

## 2023-08-02 DIAGNOSIS — H16142 Punctate keratitis, left eye: Secondary | ICD-10-CM | POA: Diagnosis not present

## 2023-08-13 DIAGNOSIS — F0390 Unspecified dementia without behavioral disturbance: Secondary | ICD-10-CM | POA: Diagnosis not present

## 2023-08-13 DIAGNOSIS — G40209 Localization-related (focal) (partial) symptomatic epilepsy and epileptic syndromes with complex partial seizures, not intractable, without status epilepticus: Secondary | ICD-10-CM | POA: Diagnosis not present

## 2023-08-13 DIAGNOSIS — I639 Cerebral infarction, unspecified: Secondary | ICD-10-CM | POA: Diagnosis not present

## 2023-10-12 DIAGNOSIS — F17210 Nicotine dependence, cigarettes, uncomplicated: Secondary | ICD-10-CM | POA: Diagnosis not present

## 2023-10-12 DIAGNOSIS — E1159 Type 2 diabetes mellitus with other circulatory complications: Secondary | ICD-10-CM | POA: Diagnosis not present

## 2023-10-12 DIAGNOSIS — E7849 Other hyperlipidemia: Secondary | ICD-10-CM | POA: Diagnosis not present

## 2023-10-12 DIAGNOSIS — I2510 Atherosclerotic heart disease of native coronary artery without angina pectoris: Secondary | ICD-10-CM | POA: Diagnosis not present

## 2023-10-12 DIAGNOSIS — Z0001 Encounter for general adult medical examination with abnormal findings: Secondary | ICD-10-CM | POA: Diagnosis not present

## 2023-10-12 DIAGNOSIS — I10 Essential (primary) hypertension: Secondary | ICD-10-CM | POA: Diagnosis not present

## 2023-10-12 DIAGNOSIS — F0390 Unspecified dementia without behavioral disturbance: Secondary | ICD-10-CM | POA: Diagnosis not present

## 2023-11-10 DIAGNOSIS — R32 Unspecified urinary incontinence: Secondary | ICD-10-CM | POA: Diagnosis not present

## 2023-11-10 DIAGNOSIS — Z881 Allergy status to other antibiotic agents status: Secondary | ICD-10-CM | POA: Diagnosis not present

## 2023-11-10 DIAGNOSIS — I252 Old myocardial infarction: Secondary | ICD-10-CM | POA: Diagnosis not present

## 2024-02-07 DIAGNOSIS — E1159 Type 2 diabetes mellitus with other circulatory complications: Secondary | ICD-10-CM | POA: Diagnosis not present

## 2024-02-07 DIAGNOSIS — G309 Alzheimer's disease, unspecified: Secondary | ICD-10-CM | POA: Diagnosis not present

## 2024-02-07 DIAGNOSIS — G4700 Insomnia, unspecified: Secondary | ICD-10-CM | POA: Diagnosis not present

## 2024-02-07 DIAGNOSIS — I10 Essential (primary) hypertension: Secondary | ICD-10-CM | POA: Diagnosis not present

## 2024-04-14 DIAGNOSIS — F29 Unspecified psychosis not due to a substance or known physiological condition: Secondary | ICD-10-CM | POA: Diagnosis not present

## 2024-04-14 DIAGNOSIS — F05 Delirium due to known physiological condition: Secondary | ICD-10-CM | POA: Diagnosis not present

## 2024-04-14 DIAGNOSIS — R4701 Aphasia: Secondary | ICD-10-CM | POA: Diagnosis not present

## 2024-04-14 DIAGNOSIS — R410 Disorientation, unspecified: Secondary | ICD-10-CM | POA: Diagnosis not present

## 2024-04-14 DIAGNOSIS — F0280 Dementia in other diseases classified elsewhere without behavioral disturbance: Secondary | ICD-10-CM | POA: Diagnosis not present

## 2024-04-14 DIAGNOSIS — F03918 Unspecified dementia, unspecified severity, with other behavioral disturbance: Secondary | ICD-10-CM | POA: Diagnosis not present

## 2024-04-14 DIAGNOSIS — I10 Essential (primary) hypertension: Secondary | ICD-10-CM | POA: Diagnosis not present

## 2024-04-14 DIAGNOSIS — R4182 Altered mental status, unspecified: Secondary | ICD-10-CM | POA: Diagnosis not present

## 2024-04-14 DIAGNOSIS — R079 Chest pain, unspecified: Secondary | ICD-10-CM | POA: Diagnosis not present

## 2024-04-14 DIAGNOSIS — Z720 Tobacco use: Secondary | ICD-10-CM | POA: Diagnosis not present

## 2024-04-29 DIAGNOSIS — K219 Gastro-esophageal reflux disease without esophagitis: Secondary | ICD-10-CM | POA: Diagnosis not present

## 2024-04-29 DIAGNOSIS — F03918 Unspecified dementia, unspecified severity, with other behavioral disturbance: Secondary | ICD-10-CM | POA: Diagnosis not present

## 2024-04-29 DIAGNOSIS — I10 Essential (primary) hypertension: Secondary | ICD-10-CM | POA: Diagnosis not present

## 2024-04-30 DIAGNOSIS — Z130 Encounter for screening for diseases of the blood and blood-forming organs and certain disorders involving the immune mechanism: Secondary | ICD-10-CM | POA: Diagnosis not present

## 2024-04-30 DIAGNOSIS — R946 Abnormal results of thyroid function studies: Secondary | ICD-10-CM | POA: Diagnosis not present

## 2024-04-30 DIAGNOSIS — F3481 Disruptive mood dysregulation disorder: Secondary | ICD-10-CM | POA: Diagnosis not present

## 2024-04-30 DIAGNOSIS — F02C3 Dementia in other diseases classified elsewhere, severe, with mood disturbance: Secondary | ICD-10-CM | POA: Diagnosis not present

## 2024-04-30 DIAGNOSIS — R7309 Other abnormal glucose: Secondary | ICD-10-CM | POA: Diagnosis not present

## 2024-05-01 DIAGNOSIS — L219 Seborrheic dermatitis, unspecified: Secondary | ICD-10-CM | POA: Diagnosis not present

## 2024-05-01 DIAGNOSIS — F0390 Unspecified dementia without behavioral disturbance: Secondary | ICD-10-CM | POA: Diagnosis not present

## 2024-05-01 DIAGNOSIS — E119 Type 2 diabetes mellitus without complications: Secondary | ICD-10-CM | POA: Diagnosis not present

## 2024-05-13 DIAGNOSIS — F03918 Unspecified dementia, unspecified severity, with other behavioral disturbance: Secondary | ICD-10-CM | POA: Diagnosis not present

## 2024-05-13 DIAGNOSIS — I252 Old myocardial infarction: Secondary | ICD-10-CM | POA: Diagnosis not present

## 2024-05-13 DIAGNOSIS — R456 Violent behavior: Secondary | ICD-10-CM | POA: Diagnosis not present

## 2024-05-13 DIAGNOSIS — F29 Unspecified psychosis not due to a substance or known physiological condition: Secondary | ICD-10-CM | POA: Diagnosis not present

## 2024-05-13 DIAGNOSIS — R001 Bradycardia, unspecified: Secondary | ICD-10-CM | POA: Diagnosis not present

## 2024-05-13 DIAGNOSIS — Z880 Allergy status to penicillin: Secondary | ICD-10-CM | POA: Diagnosis not present

## 2024-05-14 DIAGNOSIS — F332 Major depressive disorder, recurrent severe without psychotic features: Secondary | ICD-10-CM | POA: Diagnosis not present

## 2024-05-18 DIAGNOSIS — Z7401 Bed confinement status: Secondary | ICD-10-CM | POA: Diagnosis not present

## 2024-05-18 DIAGNOSIS — I252 Old myocardial infarction: Secondary | ICD-10-CM | POA: Diagnosis not present

## 2024-05-18 DIAGNOSIS — R456 Violent behavior: Secondary | ICD-10-CM | POA: Diagnosis not present

## 2024-05-18 DIAGNOSIS — I959 Hypotension, unspecified: Secondary | ICD-10-CM | POA: Diagnosis not present

## 2024-05-18 DIAGNOSIS — R451 Restlessness and agitation: Secondary | ICD-10-CM | POA: Diagnosis not present

## 2024-05-18 DIAGNOSIS — R4689 Other symptoms and signs involving appearance and behavior: Secondary | ICD-10-CM | POA: Diagnosis not present

## 2024-05-18 DIAGNOSIS — F03918 Unspecified dementia, unspecified severity, with other behavioral disturbance: Secondary | ICD-10-CM | POA: Diagnosis not present

## 2024-05-18 DIAGNOSIS — Z880 Allergy status to penicillin: Secondary | ICD-10-CM | POA: Diagnosis not present

## 2024-05-18 DIAGNOSIS — R531 Weakness: Secondary | ICD-10-CM | POA: Diagnosis not present

## 2024-06-16 DIAGNOSIS — I219 Acute myocardial infarction, unspecified: Secondary | ICD-10-CM | POA: Diagnosis not present

## 2024-06-16 DIAGNOSIS — E785 Hyperlipidemia, unspecified: Secondary | ICD-10-CM | POA: Diagnosis not present

## 2024-06-16 DIAGNOSIS — F0390 Unspecified dementia without behavioral disturbance: Secondary | ICD-10-CM | POA: Diagnosis not present

## 2024-06-16 DIAGNOSIS — K219 Gastro-esophageal reflux disease without esophagitis: Secondary | ICD-10-CM | POA: Diagnosis not present

## 2024-06-16 DIAGNOSIS — I10 Essential (primary) hypertension: Secondary | ICD-10-CM | POA: Diagnosis not present

## 2024-06-16 DIAGNOSIS — F339 Major depressive disorder, recurrent, unspecified: Secondary | ICD-10-CM | POA: Diagnosis not present

## 2024-06-18 DIAGNOSIS — F339 Major depressive disorder, recurrent, unspecified: Secondary | ICD-10-CM | POA: Diagnosis not present

## 2024-06-18 DIAGNOSIS — I10 Essential (primary) hypertension: Secondary | ICD-10-CM | POA: Diagnosis not present

## 2024-06-18 DIAGNOSIS — I219 Acute myocardial infarction, unspecified: Secondary | ICD-10-CM | POA: Diagnosis not present

## 2024-06-18 DIAGNOSIS — E559 Vitamin D deficiency, unspecified: Secondary | ICD-10-CM | POA: Diagnosis not present

## 2024-06-18 DIAGNOSIS — E785 Hyperlipidemia, unspecified: Secondary | ICD-10-CM | POA: Diagnosis not present

## 2024-06-19 DIAGNOSIS — F339 Major depressive disorder, recurrent, unspecified: Secondary | ICD-10-CM | POA: Diagnosis not present

## 2024-06-19 DIAGNOSIS — R7303 Prediabetes: Secondary | ICD-10-CM | POA: Diagnosis not present

## 2024-06-19 DIAGNOSIS — F419 Anxiety disorder, unspecified: Secondary | ICD-10-CM | POA: Diagnosis not present

## 2024-06-19 DIAGNOSIS — F0390 Unspecified dementia without behavioral disturbance: Secondary | ICD-10-CM | POA: Diagnosis not present

## 2024-06-19 DIAGNOSIS — E785 Hyperlipidemia, unspecified: Secondary | ICD-10-CM | POA: Diagnosis not present

## 2024-06-19 DIAGNOSIS — I219 Acute myocardial infarction, unspecified: Secondary | ICD-10-CM | POA: Diagnosis not present

## 2024-06-19 DIAGNOSIS — G4700 Insomnia, unspecified: Secondary | ICD-10-CM | POA: Diagnosis not present

## 2024-06-19 DIAGNOSIS — I10 Essential (primary) hypertension: Secondary | ICD-10-CM | POA: Diagnosis not present

## 2024-06-19 DIAGNOSIS — K219 Gastro-esophageal reflux disease without esophagitis: Secondary | ICD-10-CM | POA: Diagnosis not present

## 2024-06-23 DIAGNOSIS — F0150 Vascular dementia without behavioral disturbance: Secondary | ICD-10-CM | POA: Diagnosis not present

## 2024-06-23 DIAGNOSIS — Z5189 Encounter for other specified aftercare: Secondary | ICD-10-CM | POA: Diagnosis not present

## 2024-06-23 DIAGNOSIS — F419 Anxiety disorder, unspecified: Secondary | ICD-10-CM | POA: Diagnosis not present

## 2024-06-27 DIAGNOSIS — R4689 Other symptoms and signs involving appearance and behavior: Secondary | ICD-10-CM | POA: Diagnosis not present

## 2024-06-27 DIAGNOSIS — Z5189 Encounter for other specified aftercare: Secondary | ICD-10-CM | POA: Diagnosis not present

## 2024-06-30 DIAGNOSIS — F0150 Vascular dementia without behavioral disturbance: Secondary | ICD-10-CM | POA: Diagnosis not present

## 2024-06-30 DIAGNOSIS — F29 Unspecified psychosis not due to a substance or known physiological condition: Secondary | ICD-10-CM | POA: Diagnosis not present

## 2024-07-01 DIAGNOSIS — F411 Generalized anxiety disorder: Secondary | ICD-10-CM | POA: Diagnosis not present

## 2024-07-01 DIAGNOSIS — F32A Depression, unspecified: Secondary | ICD-10-CM | POA: Diagnosis not present

## 2024-07-01 DIAGNOSIS — R296 Repeated falls: Secondary | ICD-10-CM | POA: Diagnosis not present

## 2024-07-01 DIAGNOSIS — F0392 Unspecified dementia, unspecified severity, with psychotic disturbance: Secondary | ICD-10-CM | POA: Diagnosis not present

## 2024-07-04 DIAGNOSIS — Z5189 Encounter for other specified aftercare: Secondary | ICD-10-CM | POA: Diagnosis not present

## 2024-07-04 DIAGNOSIS — Z79899 Other long term (current) drug therapy: Secondary | ICD-10-CM | POA: Diagnosis not present

## 2024-07-04 DIAGNOSIS — F29 Unspecified psychosis not due to a substance or known physiological condition: Secondary | ICD-10-CM | POA: Diagnosis not present

## 2024-07-05 DIAGNOSIS — F0392 Unspecified dementia, unspecified severity, with psychotic disturbance: Secondary | ICD-10-CM | POA: Diagnosis not present

## 2024-07-07 DIAGNOSIS — Z79899 Other long term (current) drug therapy: Secondary | ICD-10-CM | POA: Diagnosis not present

## 2024-07-07 DIAGNOSIS — Z5189 Encounter for other specified aftercare: Secondary | ICD-10-CM | POA: Diagnosis not present

## 2024-07-07 DIAGNOSIS — F29 Unspecified psychosis not due to a substance or known physiological condition: Secondary | ICD-10-CM | POA: Diagnosis not present

## 2024-07-08 DIAGNOSIS — F411 Generalized anxiety disorder: Secondary | ICD-10-CM | POA: Diagnosis not present

## 2024-07-08 DIAGNOSIS — F32A Depression, unspecified: Secondary | ICD-10-CM | POA: Diagnosis not present

## 2024-07-08 DIAGNOSIS — F29 Unspecified psychosis not due to a substance or known physiological condition: Secondary | ICD-10-CM | POA: Diagnosis not present

## 2024-07-08 DIAGNOSIS — F0392 Unspecified dementia, unspecified severity, with psychotic disturbance: Secondary | ICD-10-CM | POA: Diagnosis not present

## 2024-07-09 DIAGNOSIS — R296 Repeated falls: Secondary | ICD-10-CM | POA: Diagnosis not present

## 2024-07-11 DIAGNOSIS — F0150 Vascular dementia without behavioral disturbance: Secondary | ICD-10-CM | POA: Diagnosis not present

## 2024-07-11 DIAGNOSIS — Z5189 Encounter for other specified aftercare: Secondary | ICD-10-CM | POA: Diagnosis not present

## 2024-07-11 DIAGNOSIS — F29 Unspecified psychosis not due to a substance or known physiological condition: Secondary | ICD-10-CM | POA: Diagnosis not present

## 2024-07-11 DIAGNOSIS — Z79899 Other long term (current) drug therapy: Secondary | ICD-10-CM | POA: Diagnosis not present

## 2024-07-14 DIAGNOSIS — G4700 Insomnia, unspecified: Secondary | ICD-10-CM | POA: Diagnosis not present

## 2024-07-14 DIAGNOSIS — Z5189 Encounter for other specified aftercare: Secondary | ICD-10-CM | POA: Diagnosis not present

## 2024-07-14 DIAGNOSIS — Z79899 Other long term (current) drug therapy: Secondary | ICD-10-CM | POA: Diagnosis not present

## 2024-07-15 DIAGNOSIS — Z9183 Wandering in diseases classified elsewhere: Secondary | ICD-10-CM | POA: Diagnosis not present

## 2024-07-15 DIAGNOSIS — F03918 Unspecified dementia, unspecified severity, with other behavioral disturbance: Secondary | ICD-10-CM | POA: Diagnosis not present

## 2024-07-15 DIAGNOSIS — Z79899 Other long term (current) drug therapy: Secondary | ICD-10-CM | POA: Diagnosis not present

## 2024-07-21 DIAGNOSIS — F03918 Unspecified dementia, unspecified severity, with other behavioral disturbance: Secondary | ICD-10-CM | POA: Diagnosis not present

## 2024-07-21 DIAGNOSIS — R296 Repeated falls: Secondary | ICD-10-CM | POA: Diagnosis not present

## 2024-07-23 DIAGNOSIS — S0990XA Unspecified injury of head, initial encounter: Secondary | ICD-10-CM | POA: Diagnosis not present

## 2024-07-23 DIAGNOSIS — W19XXXA Unspecified fall, initial encounter: Secondary | ICD-10-CM | POA: Diagnosis not present

## 2024-07-23 DIAGNOSIS — M25552 Pain in left hip: Secondary | ICD-10-CM | POA: Diagnosis not present

## 2024-07-23 DIAGNOSIS — R296 Repeated falls: Secondary | ICD-10-CM | POA: Diagnosis not present

## 2024-07-24 DIAGNOSIS — U071 COVID-19: Secondary | ICD-10-CM | POA: Diagnosis not present

## 2024-07-25 DIAGNOSIS — U071 COVID-19: Secondary | ICD-10-CM | POA: Diagnosis not present

## 2024-07-25 DIAGNOSIS — F03918 Unspecified dementia, unspecified severity, with other behavioral disturbance: Secondary | ICD-10-CM | POA: Diagnosis not present

## 2024-07-29 DIAGNOSIS — U071 COVID-19: Secondary | ICD-10-CM | POA: Diagnosis not present

## 2024-07-29 DIAGNOSIS — R296 Repeated falls: Secondary | ICD-10-CM | POA: Diagnosis not present

## 2024-07-29 DIAGNOSIS — F03918 Unspecified dementia, unspecified severity, with other behavioral disturbance: Secondary | ICD-10-CM | POA: Diagnosis not present

## 2024-07-30 DIAGNOSIS — F03918 Unspecified dementia, unspecified severity, with other behavioral disturbance: Secondary | ICD-10-CM | POA: Diagnosis not present

## 2024-07-30 DIAGNOSIS — U071 COVID-19: Secondary | ICD-10-CM | POA: Diagnosis not present

## 2024-07-30 DIAGNOSIS — Z9189 Other specified personal risk factors, not elsewhere classified: Secondary | ICD-10-CM | POA: Diagnosis not present

## 2024-07-31 DIAGNOSIS — Z9189 Other specified personal risk factors, not elsewhere classified: Secondary | ICD-10-CM | POA: Diagnosis not present

## 2024-07-31 DIAGNOSIS — F29 Unspecified psychosis not due to a substance or known physiological condition: Secondary | ICD-10-CM | POA: Diagnosis not present

## 2024-07-31 DIAGNOSIS — F0392 Unspecified dementia, unspecified severity, with psychotic disturbance: Secondary | ICD-10-CM | POA: Diagnosis not present

## 2024-07-31 DIAGNOSIS — K5900 Constipation, unspecified: Secondary | ICD-10-CM | POA: Diagnosis not present

## 2024-07-31 DIAGNOSIS — U071 COVID-19: Secondary | ICD-10-CM | POA: Diagnosis not present

## 2024-07-31 DIAGNOSIS — F03918 Unspecified dementia, unspecified severity, with other behavioral disturbance: Secondary | ICD-10-CM | POA: Diagnosis not present

## 2024-08-01 DIAGNOSIS — Z9189 Other specified personal risk factors, not elsewhere classified: Secondary | ICD-10-CM | POA: Diagnosis not present

## 2024-08-01 DIAGNOSIS — U071 COVID-19: Secondary | ICD-10-CM | POA: Diagnosis not present

## 2024-08-01 DIAGNOSIS — F03918 Unspecified dementia, unspecified severity, with other behavioral disturbance: Secondary | ICD-10-CM | POA: Diagnosis not present

## 2024-08-04 DIAGNOSIS — R296 Repeated falls: Secondary | ICD-10-CM | POA: Diagnosis not present

## 2024-08-04 DIAGNOSIS — U071 COVID-19: Secondary | ICD-10-CM | POA: Diagnosis not present

## 2024-08-04 DIAGNOSIS — E860 Dehydration: Secondary | ICD-10-CM | POA: Diagnosis not present

## 2024-08-05 DIAGNOSIS — F03918 Unspecified dementia, unspecified severity, with other behavioral disturbance: Secondary | ICD-10-CM | POA: Diagnosis not present

## 2024-08-05 DIAGNOSIS — Z7189 Other specified counseling: Secondary | ICD-10-CM | POA: Diagnosis not present

## 2024-08-07 DIAGNOSIS — B001 Herpesviral vesicular dermatitis: Secondary | ICD-10-CM | POA: Diagnosis not present

## 2024-08-12 DIAGNOSIS — F419 Anxiety disorder, unspecified: Secondary | ICD-10-CM | POA: Diagnosis not present

## 2024-08-12 DIAGNOSIS — F03918 Unspecified dementia, unspecified severity, with other behavioral disturbance: Secondary | ICD-10-CM | POA: Diagnosis not present

## 2024-08-21 DIAGNOSIS — F03918 Unspecified dementia, unspecified severity, with other behavioral disturbance: Secondary | ICD-10-CM | POA: Diagnosis not present

## 2024-08-21 DIAGNOSIS — F329 Major depressive disorder, single episode, unspecified: Secondary | ICD-10-CM | POA: Diagnosis not present

## 2024-08-21 DIAGNOSIS — I10 Essential (primary) hypertension: Secondary | ICD-10-CM | POA: Diagnosis not present

## 2024-08-21 DIAGNOSIS — R569 Unspecified convulsions: Secondary | ICD-10-CM | POA: Diagnosis not present

## 2024-08-21 DIAGNOSIS — F0150 Vascular dementia without behavioral disturbance: Secondary | ICD-10-CM | POA: Diagnosis not present

## 2024-08-21 DIAGNOSIS — F29 Unspecified psychosis not due to a substance or known physiological condition: Secondary | ICD-10-CM | POA: Diagnosis not present

## 2024-08-21 DIAGNOSIS — F419 Anxiety disorder, unspecified: Secondary | ICD-10-CM | POA: Diagnosis not present

## 2024-08-21 DIAGNOSIS — S069XAA Unspecified intracranial injury with loss of consciousness status unknown, initial encounter: Secondary | ICD-10-CM | POA: Diagnosis not present

## 2024-08-28 DIAGNOSIS — K117 Disturbances of salivary secretion: Secondary | ICD-10-CM | POA: Diagnosis not present

## 2024-08-28 DIAGNOSIS — F0150 Vascular dementia without behavioral disturbance: Secondary | ICD-10-CM | POA: Diagnosis not present

## 2024-09-01 DIAGNOSIS — L409 Psoriasis, unspecified: Secondary | ICD-10-CM | POA: Diagnosis not present

## 2024-09-04 DIAGNOSIS — B351 Tinea unguium: Secondary | ICD-10-CM | POA: Diagnosis not present

## 2024-09-04 DIAGNOSIS — Z87820 Personal history of traumatic brain injury: Secondary | ICD-10-CM | POA: Diagnosis not present

## 2024-09-04 DIAGNOSIS — G4089 Other seizures: Secondary | ICD-10-CM | POA: Diagnosis not present

## 2024-09-10 DIAGNOSIS — G4700 Insomnia, unspecified: Secondary | ICD-10-CM | POA: Diagnosis not present

## 2024-09-10 DIAGNOSIS — F0392 Unspecified dementia, unspecified severity, with psychotic disturbance: Secondary | ICD-10-CM | POA: Diagnosis not present

## 2024-09-10 DIAGNOSIS — F32A Depression, unspecified: Secondary | ICD-10-CM | POA: Diagnosis not present

## 2024-09-10 DIAGNOSIS — F29 Unspecified psychosis not due to a substance or known physiological condition: Secondary | ICD-10-CM | POA: Diagnosis not present

## 2024-09-18 DIAGNOSIS — I252 Old myocardial infarction: Secondary | ICD-10-CM | POA: Diagnosis not present

## 2024-09-18 DIAGNOSIS — F0150 Vascular dementia without behavioral disturbance: Secondary | ICD-10-CM | POA: Diagnosis not present

## 2024-09-18 DIAGNOSIS — F329 Major depressive disorder, single episode, unspecified: Secondary | ICD-10-CM | POA: Diagnosis not present

## 2024-09-18 DIAGNOSIS — Z87898 Personal history of other specified conditions: Secondary | ICD-10-CM | POA: Diagnosis not present

## 2024-09-18 DIAGNOSIS — I10 Essential (primary) hypertension: Secondary | ICD-10-CM | POA: Diagnosis not present

## 2024-09-23 DIAGNOSIS — R300 Dysuria: Secondary | ICD-10-CM | POA: Diagnosis not present

## 2024-09-23 DIAGNOSIS — F419 Anxiety disorder, unspecified: Secondary | ICD-10-CM | POA: Diagnosis not present

## 2024-09-25 DIAGNOSIS — F339 Major depressive disorder, recurrent, unspecified: Secondary | ICD-10-CM | POA: Diagnosis not present

## 2024-09-25 DIAGNOSIS — K219 Gastro-esophageal reflux disease without esophagitis: Secondary | ICD-10-CM | POA: Diagnosis not present

## 2024-09-25 DIAGNOSIS — Z6822 Body mass index (BMI) 22.0-22.9, adult: Secondary | ICD-10-CM | POA: Diagnosis not present

## 2024-09-25 DIAGNOSIS — N390 Urinary tract infection, site not specified: Secondary | ICD-10-CM | POA: Diagnosis not present

## 2024-10-06 DIAGNOSIS — F419 Anxiety disorder, unspecified: Secondary | ICD-10-CM | POA: Diagnosis not present

## 2024-10-13 DIAGNOSIS — R296 Repeated falls: Secondary | ICD-10-CM | POA: Diagnosis not present

## 2024-10-16 DIAGNOSIS — Z23 Encounter for immunization: Secondary | ICD-10-CM | POA: Diagnosis not present

## 2024-10-16 DIAGNOSIS — F01518 Vascular dementia, unspecified severity, with other behavioral disturbance: Secondary | ICD-10-CM | POA: Diagnosis not present

## 2024-10-16 DIAGNOSIS — R1311 Dysphagia, oral phase: Secondary | ICD-10-CM | POA: Diagnosis not present

## 2024-10-17 DIAGNOSIS — R1310 Dysphagia, unspecified: Secondary | ICD-10-CM | POA: Diagnosis not present

## 2024-10-17 DIAGNOSIS — L89159 Pressure ulcer of sacral region, unspecified stage: Secondary | ICD-10-CM | POA: Diagnosis not present

## 2024-10-20 DIAGNOSIS — L989 Disorder of the skin and subcutaneous tissue, unspecified: Secondary | ICD-10-CM | POA: Diagnosis not present

## 2024-10-20 DIAGNOSIS — F29 Unspecified psychosis not due to a substance or known physiological condition: Secondary | ICD-10-CM | POA: Diagnosis not present

## 2024-10-20 DIAGNOSIS — G4089 Other seizures: Secondary | ICD-10-CM | POA: Diagnosis not present

## 2024-10-20 DIAGNOSIS — F01518 Vascular dementia, unspecified severity, with other behavioral disturbance: Secondary | ICD-10-CM | POA: Diagnosis not present

## 2024-10-24 DIAGNOSIS — R296 Repeated falls: Secondary | ICD-10-CM | POA: Diagnosis not present

## 2024-11-03 DIAGNOSIS — L98492 Non-pressure chronic ulcer of skin of other sites with fat layer exposed: Secondary | ICD-10-CM | POA: Diagnosis not present
